# Patient Record
Sex: Female | Born: 1945 | Race: White | Hispanic: No | Marital: Married | Smoking: Never smoker
Health system: Southern US, Community
[De-identification: ages and names within clinical notes are randomized; demographics above are authoritative.]

## PROBLEM LIST (undated history)

## (undated) DIAGNOSIS — I10 Essential (primary) hypertension: Secondary | ICD-10-CM

## (undated) DIAGNOSIS — J45909 Unspecified asthma, uncomplicated: Secondary | ICD-10-CM

## (undated) DIAGNOSIS — R001 Bradycardia, unspecified: Secondary | ICD-10-CM

## (undated) DIAGNOSIS — D369 Benign neoplasm, unspecified site: Secondary | ICD-10-CM

## (undated) DIAGNOSIS — I498 Other specified cardiac arrhythmias: Secondary | ICD-10-CM

## (undated) DIAGNOSIS — D649 Anemia, unspecified: Secondary | ICD-10-CM

## (undated) DIAGNOSIS — E039 Hypothyroidism, unspecified: Secondary | ICD-10-CM

## (undated) DIAGNOSIS — K589 Irritable bowel syndrome without diarrhea: Secondary | ICD-10-CM

## (undated) DIAGNOSIS — J449 Chronic obstructive pulmonary disease, unspecified: Secondary | ICD-10-CM

## (undated) DIAGNOSIS — M1990 Unspecified osteoarthritis, unspecified site: Secondary | ICD-10-CM

## (undated) DIAGNOSIS — K5790 Diverticulosis of intestine, part unspecified, without perforation or abscess without bleeding: Secondary | ICD-10-CM

## (undated) DIAGNOSIS — K219 Gastro-esophageal reflux disease without esophagitis: Secondary | ICD-10-CM

## (undated) DIAGNOSIS — J439 Emphysema, unspecified: Secondary | ICD-10-CM

## (undated) DIAGNOSIS — I341 Nonrheumatic mitral (valve) prolapse: Secondary | ICD-10-CM

## (undated) HISTORY — DX: Emphysema, unspecified: J43.9

## (undated) HISTORY — PX: BACK SURGERY: SHX140

## (undated) HISTORY — DX: Unspecified osteoarthritis, unspecified site: M19.90

## (undated) HISTORY — DX: Irritable bowel syndrome, unspecified: K58.9

## (undated) HISTORY — DX: Other specified cardiac arrhythmias: I49.8

## (undated) HISTORY — PX: BLADDER SURGERY: SHX569

## (undated) HISTORY — PX: EYE SURGERY: SHX253

## (undated) HISTORY — DX: Diverticulosis of intestine, part unspecified, without perforation or abscess without bleeding: K57.90

## (undated) HISTORY — DX: Nonrheumatic mitral (valve) prolapse: I34.1

## (undated) HISTORY — DX: Essential (primary) hypertension: I10

## (undated) HISTORY — DX: Anemia, unspecified: D64.9

## (undated) HISTORY — DX: Hypothyroidism, unspecified: E03.9

## (undated) HISTORY — DX: Chronic obstructive pulmonary disease, unspecified: J44.9

## (undated) HISTORY — DX: Unspecified asthma, uncomplicated: J45.909

## (undated) HISTORY — PX: OTHER SURGICAL HISTORY: SHX169

## (undated) HISTORY — PX: ABDOMINAL HYSTERECTOMY: SHX81

## (undated) HISTORY — PX: APPENDECTOMY: SHX54

## (undated) HISTORY — DX: Benign neoplasm, unspecified site: D36.9

## (undated) HISTORY — DX: Bradycardia, unspecified: R00.1

## (undated) HISTORY — PX: CHOLECYSTECTOMY: SHX55

## (undated) HISTORY — DX: Gastro-esophageal reflux disease without esophagitis: K21.9

---

## 1971-03-06 HISTORY — PX: APPENDECTOMY: SHX54

## 1974-03-05 HISTORY — PX: OTHER SURGICAL HISTORY: SHX169

## 1978-03-05 HISTORY — PX: CHOLECYSTECTOMY: SHX55

## 1992-03-05 HISTORY — PX: OTHER SURGICAL HISTORY: SHX169

## 1997-06-24 ENCOUNTER — Encounter: Admission: RE | Admit: 1997-06-24 | Discharge: 1997-06-24 | Payer: Self-pay | Admitting: Internal Medicine

## 1997-10-13 ENCOUNTER — Encounter: Admission: RE | Admit: 1997-10-13 | Discharge: 1997-10-13 | Payer: Self-pay | Admitting: Hematology and Oncology

## 1997-10-25 ENCOUNTER — Encounter: Admission: RE | Admit: 1997-10-25 | Discharge: 1997-10-25 | Payer: Self-pay | Admitting: Family Medicine

## 1997-11-24 ENCOUNTER — Encounter: Admission: RE | Admit: 1997-11-24 | Discharge: 1997-11-24 | Payer: Self-pay | Admitting: Family Medicine

## 1998-03-11 ENCOUNTER — Encounter: Admission: RE | Admit: 1998-03-11 | Discharge: 1998-03-11 | Payer: Self-pay | Admitting: Sports Medicine

## 1998-05-24 ENCOUNTER — Encounter: Admission: RE | Admit: 1998-05-24 | Discharge: 1998-05-24 | Payer: Self-pay | Admitting: Sports Medicine

## 1998-06-08 ENCOUNTER — Encounter: Admission: RE | Admit: 1998-06-08 | Discharge: 1998-06-08 | Payer: Self-pay | Admitting: Family Medicine

## 1998-07-11 ENCOUNTER — Other Ambulatory Visit: Admission: RE | Admit: 1998-07-11 | Discharge: 1998-07-11 | Payer: Self-pay

## 1999-02-28 ENCOUNTER — Encounter: Admission: RE | Admit: 1999-02-28 | Discharge: 1999-02-28 | Payer: Self-pay | Admitting: Family Medicine

## 1999-03-06 HISTORY — PX: OTHER SURGICAL HISTORY: SHX169

## 1999-03-21 ENCOUNTER — Encounter: Admission: RE | Admit: 1999-03-21 | Discharge: 1999-03-21 | Payer: Self-pay | Admitting: Family Medicine

## 1999-09-07 ENCOUNTER — Encounter: Admission: RE | Admit: 1999-09-07 | Discharge: 1999-09-07 | Payer: Self-pay | Admitting: Family Medicine

## 2000-06-24 ENCOUNTER — Encounter: Payer: Self-pay | Admitting: Neurosurgery

## 2000-06-24 ENCOUNTER — Encounter: Admission: RE | Admit: 2000-06-24 | Discharge: 2000-06-24 | Payer: Self-pay | Admitting: Neurosurgery

## 2001-03-26 ENCOUNTER — Ambulatory Visit (HOSPITAL_COMMUNITY): Admission: RE | Admit: 2001-03-26 | Discharge: 2001-03-26 | Payer: Self-pay | Admitting: Neurosurgery

## 2001-08-04 ENCOUNTER — Encounter: Admission: RE | Admit: 2001-08-04 | Discharge: 2001-08-04 | Payer: Self-pay | Admitting: Family Medicine

## 2001-08-04 ENCOUNTER — Encounter: Payer: Self-pay | Admitting: Family Medicine

## 2001-08-28 ENCOUNTER — Encounter: Admission: RE | Admit: 2001-08-28 | Discharge: 2001-08-28 | Payer: Self-pay | Admitting: Otolaryngology

## 2001-08-28 ENCOUNTER — Encounter: Payer: Self-pay | Admitting: Otolaryngology

## 2001-10-27 ENCOUNTER — Encounter: Payer: Self-pay | Admitting: Internal Medicine

## 2001-10-27 ENCOUNTER — Inpatient Hospital Stay (HOSPITAL_COMMUNITY): Admission: EM | Admit: 2001-10-27 | Discharge: 2001-10-29 | Payer: Self-pay | Admitting: Emergency Medicine

## 2001-11-06 ENCOUNTER — Ambulatory Visit (HOSPITAL_COMMUNITY): Admission: RE | Admit: 2001-11-06 | Discharge: 2001-11-06 | Payer: Self-pay | Admitting: Gastroenterology

## 2002-01-06 ENCOUNTER — Ambulatory Visit (HOSPITAL_COMMUNITY): Admission: RE | Admit: 2002-01-06 | Discharge: 2002-01-06 | Payer: Self-pay | Admitting: Neurosurgery

## 2002-03-26 ENCOUNTER — Observation Stay (HOSPITAL_COMMUNITY): Admission: RE | Admit: 2002-03-26 | Discharge: 2002-03-27 | Payer: Self-pay | Admitting: General Surgery

## 2002-03-26 ENCOUNTER — Encounter: Payer: Self-pay | Admitting: General Surgery

## 2002-06-11 ENCOUNTER — Ambulatory Visit (HOSPITAL_COMMUNITY): Admission: RE | Admit: 2002-06-11 | Discharge: 2002-06-11 | Payer: Self-pay | Admitting: Gastroenterology

## 2002-06-11 ENCOUNTER — Encounter (INDEPENDENT_AMBULATORY_CARE_PROVIDER_SITE_OTHER): Payer: Self-pay | Admitting: *Deleted

## 2002-06-17 ENCOUNTER — Other Ambulatory Visit: Admission: RE | Admit: 2002-06-17 | Discharge: 2002-06-17 | Payer: Self-pay

## 2002-06-26 ENCOUNTER — Encounter: Admission: RE | Admit: 2002-06-26 | Discharge: 2002-06-26 | Payer: Self-pay

## 2002-07-17 ENCOUNTER — Encounter: Admission: RE | Admit: 2002-07-17 | Discharge: 2002-07-17 | Payer: Self-pay | Admitting: Family Medicine

## 2002-07-17 ENCOUNTER — Encounter: Payer: Self-pay | Admitting: Family Medicine

## 2003-07-15 ENCOUNTER — Encounter: Admission: RE | Admit: 2003-07-15 | Discharge: 2003-07-15 | Payer: Self-pay | Admitting: Family Medicine

## 2005-03-22 ENCOUNTER — Encounter: Admission: RE | Admit: 2005-03-22 | Discharge: 2005-03-22 | Payer: Self-pay | Admitting: Family Medicine

## 2005-05-11 ENCOUNTER — Encounter (INDEPENDENT_AMBULATORY_CARE_PROVIDER_SITE_OTHER): Payer: Self-pay | Admitting: *Deleted

## 2005-05-11 ENCOUNTER — Encounter: Admission: RE | Admit: 2005-05-11 | Discharge: 2005-05-11 | Payer: Self-pay | Admitting: Family Medicine

## 2005-07-10 ENCOUNTER — Encounter: Admission: RE | Admit: 2005-07-10 | Discharge: 2005-07-10 | Payer: Self-pay | Admitting: Neurology

## 2005-10-23 ENCOUNTER — Inpatient Hospital Stay (HOSPITAL_COMMUNITY): Admission: RE | Admit: 2005-10-23 | Discharge: 2005-10-30 | Payer: Self-pay | Admitting: Neurosurgery

## 2006-03-05 HISTORY — PX: ABDOMINAL HERNIA REPAIR: SHX539

## 2006-03-05 HISTORY — PX: BRAIN SURGERY: SHX531

## 2007-03-06 HISTORY — PX: OTHER SURGICAL HISTORY: SHX169

## 2007-11-24 ENCOUNTER — Encounter: Payer: Self-pay | Admitting: Gastroenterology

## 2008-05-17 ENCOUNTER — Ambulatory Visit: Payer: Self-pay | Admitting: Family Medicine

## 2008-05-17 DIAGNOSIS — R1013 Epigastric pain: Secondary | ICD-10-CM

## 2008-05-17 DIAGNOSIS — K5900 Constipation, unspecified: Secondary | ICD-10-CM | POA: Insufficient documentation

## 2008-05-17 DIAGNOSIS — K219 Gastro-esophageal reflux disease without esophagitis: Secondary | ICD-10-CM

## 2008-05-18 ENCOUNTER — Encounter: Payer: Self-pay | Admitting: Family Medicine

## 2008-05-18 LAB — CONVERTED CEMR LAB
AST: 13 units/L (ref 0–37)
Albumin: 4.6 g/dL (ref 3.5–5.2)
Alkaline Phosphatase: 70 units/L (ref 39–117)
Basophils Absolute: 0.1 10*3/uL (ref 0.0–0.1)
Basophils Relative: 1 % (ref 0–1)
Calcium: 9.5 mg/dL (ref 8.4–10.5)
Eosinophils Absolute: 0.1 10*3/uL (ref 0.0–0.7)
Eosinophils Relative: 2 % (ref 0–5)
HCT: 35.1 % — ABNORMAL LOW (ref 36.0–46.0)
Lymphocytes Relative: 49 % — ABNORMAL HIGH (ref 12–46)
MCHC: 31.9 g/dL (ref 30.0–36.0)
MCV: 95.9 fL (ref 78.0–100.0)
Monocytes Absolute: 0.7 10*3/uL (ref 0.1–1.0)
Monocytes Relative: 9 % (ref 3–12)
RBC: 3.66 M/uL — ABNORMAL LOW (ref 3.87–5.11)
Total Bilirubin: 0.3 mg/dL (ref 0.3–1.2)
Total Protein: 7.3 g/dL (ref 6.0–8.3)

## 2008-05-19 LAB — CONVERTED CEMR LAB
T3, Free: 2.7 pg/mL (ref 2.3–4.2)
TIBC: 354 ug/dL (ref 250–470)
UIBC: 293 ug/dL
Vitamin B-12: 429 pg/mL (ref 211–911)

## 2008-06-15 ENCOUNTER — Ambulatory Visit: Payer: Self-pay | Admitting: Family Medicine

## 2008-06-17 LAB — CONVERTED CEMR LAB
AST: 14 units/L (ref 0–37)
Albumin: 4.4 g/dL (ref 3.5–5.2)
Alkaline Phosphatase: 71 units/L (ref 39–117)
Basophils Absolute: 0 10*3/uL (ref 0.0–0.1)
CO2: 25 meq/L (ref 19–32)
Eosinophils Absolute: 0.1 10*3/uL (ref 0.0–0.7)
Eosinophils Relative: 1 % (ref 0–5)
Glucose, Bld: 91 mg/dL (ref 70–99)
HCT: 35.5 % — ABNORMAL LOW (ref 36.0–46.0)
Hemoglobin: 11.3 g/dL — ABNORMAL LOW (ref 12.0–15.0)
Lipase: 31 units/L (ref 0–75)
MCHC: 31.8 g/dL (ref 30.0–36.0)
MCV: 97.8 fL (ref 78.0–100.0)
Monocytes Relative: 9 % (ref 3–12)
Neutro Abs: 2.6 10*3/uL (ref 1.7–7.7)
RBC: 3.63 M/uL — ABNORMAL LOW (ref 3.87–5.11)
Total Bilirubin: 0.3 mg/dL (ref 0.3–1.2)
WBC: 6.3 10*3/uL (ref 4.0–10.5)

## 2008-07-14 ENCOUNTER — Ambulatory Visit: Payer: Self-pay | Admitting: Gastroenterology

## 2008-07-14 LAB — CONVERTED CEMR LAB: Sed Rate: 20 mm/hr (ref 0–22)

## 2008-07-16 ENCOUNTER — Ambulatory Visit: Payer: Self-pay | Admitting: Cardiology

## 2008-07-19 ENCOUNTER — Ambulatory Visit: Payer: Self-pay | Admitting: Gastroenterology

## 2008-07-19 ENCOUNTER — Encounter: Payer: Self-pay | Admitting: Gastroenterology

## 2008-07-19 LAB — CONVERTED CEMR LAB: Tissue Transglutaminase Ab, IgA: 0 units (ref <7)

## 2008-07-22 ENCOUNTER — Encounter: Payer: Self-pay | Admitting: Gastroenterology

## 2008-07-28 ENCOUNTER — Ambulatory Visit: Payer: Self-pay | Admitting: Gastroenterology

## 2008-07-30 ENCOUNTER — Encounter: Payer: Self-pay | Admitting: Gastroenterology

## 2008-08-03 ENCOUNTER — Encounter (INDEPENDENT_AMBULATORY_CARE_PROVIDER_SITE_OTHER): Payer: Self-pay | Admitting: *Deleted

## 2008-08-25 ENCOUNTER — Encounter: Payer: Self-pay | Admitting: Cardiology

## 2008-08-25 ENCOUNTER — Ambulatory Visit: Payer: Self-pay | Admitting: Family Medicine

## 2008-08-25 DIAGNOSIS — I498 Other specified cardiac arrhythmias: Secondary | ICD-10-CM

## 2008-08-25 DIAGNOSIS — R5383 Other fatigue: Secondary | ICD-10-CM | POA: Insufficient documentation

## 2008-08-25 DIAGNOSIS — R5381 Other malaise: Secondary | ICD-10-CM

## 2008-08-25 DIAGNOSIS — R109 Unspecified abdominal pain: Secondary | ICD-10-CM | POA: Insufficient documentation

## 2008-08-25 DIAGNOSIS — E039 Hypothyroidism, unspecified: Secondary | ICD-10-CM | POA: Insufficient documentation

## 2008-08-25 HISTORY — DX: Other specified cardiac arrhythmias: I49.8

## 2008-08-25 LAB — CONVERTED CEMR LAB
Bilirubin Urine: NEGATIVE
Glucose, Urine, Semiquant: NEGATIVE
Urobilinogen, UA: 0.2

## 2008-08-26 ENCOUNTER — Encounter: Payer: Self-pay | Admitting: Family Medicine

## 2008-08-26 LAB — CONVERTED CEMR LAB
ALT: 13 units/L (ref 0–35)
AST: 16 units/L (ref 0–37)
Basophils Relative: 1 % (ref 0–1)
Calcium: 9.4 mg/dL (ref 8.4–10.5)
Eosinophils Absolute: 0.1 10*3/uL (ref 0.0–0.7)
Eosinophils Relative: 1 % (ref 0–5)
Glucose, Bld: 104 mg/dL — ABNORMAL HIGH (ref 70–99)
Hemoglobin: 11.2 g/dL — ABNORMAL LOW (ref 12.0–15.0)
MCHC: 32.7 g/dL (ref 30.0–36.0)
MCV: 96.6 fL (ref 78.0–100.0)
RBC: 3.55 M/uL — ABNORMAL LOW (ref 3.87–5.11)
TSH: 5.032 microintl units/mL — ABNORMAL HIGH (ref 0.350–4.500)
Total Protein: 7.2 g/dL (ref 6.0–8.3)
WBC: 6.5 10*3/uL (ref 4.0–10.5)

## 2008-08-27 LAB — CONVERTED CEMR LAB
Saturation Ratios: 27 % (ref 20–55)
UIBC: 273 ug/dL

## 2008-09-01 ENCOUNTER — Encounter: Payer: Self-pay | Admitting: Cardiology

## 2008-09-01 ENCOUNTER — Ambulatory Visit: Payer: Self-pay | Admitting: Cardiology

## 2008-09-21 ENCOUNTER — Ambulatory Visit: Payer: Self-pay | Admitting: Gastroenterology

## 2008-09-28 ENCOUNTER — Ambulatory Visit: Payer: Self-pay | Admitting: Cardiology

## 2008-09-28 ENCOUNTER — Ambulatory Visit: Payer: Self-pay

## 2008-11-09 ENCOUNTER — Telehealth: Payer: Self-pay | Admitting: Cardiology

## 2008-11-19 ENCOUNTER — Ambulatory Visit: Payer: Self-pay | Admitting: Gastroenterology

## 2008-11-19 LAB — CONVERTED CEMR LAB: Creatinine, Ser: 0.8 mg/dL (ref 0.4–1.2)

## 2008-11-24 ENCOUNTER — Telehealth (INDEPENDENT_AMBULATORY_CARE_PROVIDER_SITE_OTHER): Payer: Self-pay | Admitting: *Deleted

## 2008-11-30 ENCOUNTER — Encounter: Payer: Self-pay | Admitting: Gastroenterology

## 2008-11-30 DIAGNOSIS — R933 Abnormal findings on diagnostic imaging of other parts of digestive tract: Secondary | ICD-10-CM | POA: Insufficient documentation

## 2008-12-01 ENCOUNTER — Telehealth (INDEPENDENT_AMBULATORY_CARE_PROVIDER_SITE_OTHER): Payer: Self-pay | Admitting: *Deleted

## 2008-12-03 ENCOUNTER — Telehealth: Payer: Self-pay | Admitting: Gastroenterology

## 2008-12-08 ENCOUNTER — Encounter: Payer: Self-pay | Admitting: Cardiology

## 2008-12-08 ENCOUNTER — Ambulatory Visit: Payer: Self-pay | Admitting: Cardiology

## 2008-12-09 ENCOUNTER — Telehealth (INDEPENDENT_AMBULATORY_CARE_PROVIDER_SITE_OTHER): Payer: Self-pay | Admitting: *Deleted

## 2008-12-09 ENCOUNTER — Ambulatory Visit (HOSPITAL_COMMUNITY): Admission: RE | Admit: 2008-12-09 | Discharge: 2008-12-09 | Payer: Self-pay | Admitting: Gastroenterology

## 2008-12-15 ENCOUNTER — Encounter: Payer: Self-pay | Admitting: Family Medicine

## 2008-12-20 ENCOUNTER — Telehealth: Payer: Self-pay | Admitting: Gastroenterology

## 2008-12-21 ENCOUNTER — Ambulatory Visit (HOSPITAL_COMMUNITY): Admission: RE | Admit: 2008-12-21 | Discharge: 2008-12-21 | Payer: Self-pay | Admitting: Gastroenterology

## 2009-01-06 ENCOUNTER — Ambulatory Visit: Payer: Self-pay | Admitting: Internal Medicine

## 2009-01-06 ENCOUNTER — Ambulatory Visit (HOSPITAL_COMMUNITY): Admission: RE | Admit: 2009-01-06 | Discharge: 2009-01-06 | Payer: Self-pay | Admitting: Cardiology

## 2009-01-06 ENCOUNTER — Encounter: Payer: Self-pay | Admitting: Cardiology

## 2009-01-06 ENCOUNTER — Ambulatory Visit: Payer: Self-pay

## 2009-05-30 ENCOUNTER — Encounter: Payer: Self-pay | Admitting: Family Medicine

## 2009-08-22 ENCOUNTER — Encounter: Admission: RE | Admit: 2009-08-22 | Discharge: 2009-08-22 | Payer: Self-pay | Admitting: Neurosurgery

## 2009-09-13 ENCOUNTER — Ambulatory Visit (HOSPITAL_COMMUNITY): Admission: RE | Admit: 2009-09-13 | Discharge: 2009-09-13 | Payer: Self-pay | Admitting: Podiatry

## 2009-09-29 ENCOUNTER — Encounter: Admission: RE | Admit: 2009-09-29 | Discharge: 2009-09-29 | Payer: Self-pay | Admitting: Neurosurgery

## 2009-11-11 ENCOUNTER — Encounter: Payer: Self-pay | Admitting: Gastroenterology

## 2009-11-11 ENCOUNTER — Telehealth: Payer: Self-pay | Admitting: Gastroenterology

## 2009-11-22 ENCOUNTER — Ambulatory Visit: Payer: Self-pay | Admitting: Gastroenterology

## 2009-11-22 ENCOUNTER — Encounter (INDEPENDENT_AMBULATORY_CARE_PROVIDER_SITE_OTHER): Payer: Self-pay | Admitting: *Deleted

## 2009-11-22 DIAGNOSIS — R1319 Other dysphagia: Secondary | ICD-10-CM | POA: Insufficient documentation

## 2009-11-22 DIAGNOSIS — D509 Iron deficiency anemia, unspecified: Secondary | ICD-10-CM | POA: Insufficient documentation

## 2009-11-22 DIAGNOSIS — R160 Hepatomegaly, not elsewhere classified: Secondary | ICD-10-CM

## 2009-11-22 LAB — CONVERTED CEMR LAB
ALT: 14 units/L (ref 0–35)
AST: 19 units/L (ref 0–37)
Alkaline Phosphatase: 80 units/L (ref 39–117)
Amylase: 93 units/L (ref 27–131)
Bilirubin, Direct: 0 mg/dL (ref 0.0–0.3)
Calcium: 9.5 mg/dL (ref 8.4–10.5)
Chloride: 105 meq/L (ref 96–112)
GFR calc non Af Amer: 80.32 mL/min (ref >60)
HCT: 30.2 % — ABNORMAL LOW (ref 36.0–46.0)
Iron: 60 ug/dL (ref 42–145)
Lipase: 40 units/L (ref 11.0–59.0)
Lymphocytes Relative: 32.5 % (ref 12.0–46.0)
Lymphs Abs: 2 10*3/uL (ref 0.7–4.0)
MCV: 93.8 fL (ref 78.0–100.0)
Magnesium: 1.9 mg/dL (ref 1.5–2.5)
Monocytes Absolute: 0.5 10*3/uL (ref 0.1–1.0)
Neutrophils Relative %: 55.3 % (ref 43.0–77.0)
Potassium: 4.2 meq/L (ref 3.5–5.1)
RBC: 3.22 M/uL — ABNORMAL LOW (ref 3.87–5.11)
TSH: 1.63 microintl units/mL (ref 0.35–5.50)
Tissue Transglutaminase Ab, IgA: 1.2 units (ref <20)
Transferrin: 272 mg/dL (ref 212.0–360.0)

## 2009-11-30 ENCOUNTER — Ambulatory Visit: Payer: Self-pay | Admitting: Gastroenterology

## 2009-12-08 DIAGNOSIS — K200 Eosinophilic esophagitis: Secondary | ICD-10-CM

## 2009-12-15 ENCOUNTER — Encounter: Payer: Self-pay | Admitting: Gastroenterology

## 2010-01-12 ENCOUNTER — Ambulatory Visit: Payer: Self-pay | Admitting: Gastroenterology

## 2010-03-26 ENCOUNTER — Encounter: Payer: Self-pay | Admitting: Gastroenterology

## 2010-04-04 NOTE — Progress Notes (Signed)
Summary: Switch from Dr Christella Hartigan to Dr Jarold Motto  Phone Note From Other Clinic   Caller: Raynelle Fanning @ Dr Maurice Small [phone]  Call For: Dr Christella Hartigan Reason for Call: Schedule Patient Appt Summary of Call: Wants to switch from Dr Christella Hartigan to Dr Jarold Motto because she says at her last rev Dr Christella Hartigan told her she couldnt come back unless she had money to pay him. Initial call taken by: Leanor Kail [facility],  November 11, 2009 3:56 PM  Follow-up for Phone Call        I don't recall ever saying that to her, but I am fine with a switch if Dr. Jarold Motto is OK with it.  I am also fine continuing to care for her. Follow-up by: Rachael Fee MD,  November 14, 2009 11:57 AM  Additional Follow-up for Phone Call Additional follow up Details #1::        Dr Jarold Motto I spoke with Aurea Graff and she said that you would have to decide whether or not you want to see the pt.  She has asked to switch, therefore you have to accept or not. Additional Follow-up by: Chales Abrahams CMA Duncan Dull),  November 14, 2009 12:59 PM    Additional Follow-up for Phone Call Additional follow up Details #2::    I doubt sincereely this is true...we are to discourage this type of Dr. transfer...ask Aurea Graff.... Follow-up by: Mardella Layman MD FACG,  November 14, 2009 12:22 PM  Additional Follow-up for Phone Call Additional follow up Details #3:: Details for Additional Follow-up Action Taken: ok Additional Follow-up by: Mardella Layman MD FACG,  November 14, 2009 1:37 PM  Dr Jarold Motto is that an OK for the switch to you?  Appended Document: Switch from Dr Christella Hartigan to Dr Jarold Motto pt scheduled with Dr Jarold Motto

## 2010-04-04 NOTE — Letter (Signed)
Summary: Patient [facility] Biopsy Results  [facility]  [address] [place], [place] [address]   Phone: [phone]  Fax: [phone]        December 15, 2009 MRN: [id number]    Jenna Gutierrez [address] [place], [place]  [address]    Dear [name],  I am pleased to inform you that the biopsies taken during your recent endoscopic examination did not show any evidence of cancer upon pathologic examination.  Additional information/recommendations:  __No further action is needed at this time.  Please follow-up with      your primary care physician for your other healthcare needs.  __ Please call [phone] to schedule a return visit to review      your condition.  _xx_ Continue with the treatment plan as outlined on the day of your      exam.  __ You should have a repeat endoscopic examination for this problem              in _ months/years.   Please call us if you are having persistent problems or have questions about your condition that have not been fully answered at this time.  Sincerely,  Mardella Layman MD [facility]  This letter has been electronically signed by your physician.  Pt given results by Drs Merideth Abbey while Dr Jarold Motto off

## 2010-04-04 NOTE — Assessment & Plan Note (Signed)
Summary: worseing dysphagia/pl         Dr switch ok per jacobs and pat...   History of Present Illness Visit Type: Follow-up Visit Primary GI MD: Rob Bunting MD Primary Provider: Maurice Small, MD Chief Complaint: Dysphagia, food, pills getting stuck in esophagus, patient also getting stragled History of Present Illness:   Complex 65 year old white female former patient of Dr. Christella Hartigan and also Dr. Reece Agar. She is referred today by Dr. Maurice Small for evaluation of progressive solid food dysphagia. She has a previous known Schatzki's ring in her esophagus that was dilated by balloon hydrostatic techniques per Dr. Christella Hartigan approximately year ago. Small bowel biopsy and check for H. pylori was negative at that time.  She is on chronic PPI therapy for GERD and most recently was placed on Kapidex 60 mg q.a.m. She also uses p.r.n. Carafate for regurgitation and burning substernal chest pain. She has had progressive dysphagia for solids in both her retropharyngeal area and the distal esophageal area. Other problems include recurrent epigastric pain with early satiety, nausea, and alleged 40 pound weight loss. Previous gastric MUGA scan showed 35% retention at 2 hours. She has had previous colonoscopies which were unremarkable. She has regular bowel movements as long as" I do not eat". Other medical problems include asthma and mild COPD.review of recent labs shows a hemoglobin of 10.7 with a normal platelet count and white cell count.   GI Review of Systems    Reports dysphagia with liquids, dysphagia with solids, loss of appetite, nausea, and  vomiting.      Denies abdominal pain, acid reflux, belching, bloating, chest pain, heartburn, vomiting blood, weight loss, and  weight gain.         Current Medications (verified): 1)  Proair Hfa 108 (90 Base) Mcg/act Aers (Albuterol Sulfate) .... 2-4 Puffs Inhaled Every 4-6 Hours As Needed. 2)  Flonase 50 Mcg/act Susp (Fluticasone Propionate) ....  Use One Spray Each Nostril Twice A Day 3)  Kapidex 60 Mg Cpdr (Dexlansoprazole) .... Take 1 Tablet By Mouth Once A Day About 20 Min Before Breakfast. 4)  Carafate 1 Gm Tabs (Sucralfate) .... Take 1 Hour Before Each Meal and At Bedtime. 5)  Levothroid 50 Mcg Tabs (Levothyroxine Sodium) .... Take 1 Tablet By Mouth Once A Day 6)  Vitamin D 1000 Unit  Tabs (Cholecalciferol) .... Take 1 Tablet By Mouth Once A Day 7)  Nuvigil 250 Mg Tabs (Armodafinil) .... Take 1 Tablet By Mouth Two Times A Day 8)  Xyrem 500 Mg/ml Soln (Sodium Oxybate) .... Take As Directed  Allergies (verified): No Known Drug Allergies  Past History:  Past medical, surgical, family and social histories (including risk factors) reviewed for relevance to current acute and chronic problems.  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  diverticulosis MVP COPD Asthma Narcolepsy  Past Surgical History: Appendectomy 1973 Hysterectomy 1976 Cholecystectomy 1980 BAck and neck in 1994 Bladder tack in 2001 Brain surgery  in 2008 Cataracts 2009 Carpel tunnel 2001 Hernia, abdominal repair in 2008 by Dr. Weatherly [place] liver tumor removed.  Bunion Surgery    Family History: Reviewed history from 09/01/2008 and no changes required. Sister with esophageal cancer.  ovarian cancer Mother with congestive heart failure  Social History: Reviewed history from 09/01/2008 and no changes required. Widowed. On disability.  Has 2 children. Lives with a friend and her daughter.   Former Smoker - 10/2006 Alcohol use-no Drug use-no Regular exercise-no  Review of Systems       The patient complains of anemia, back pain, fatigue, and swelling of feet/legs.  The patient denies allergy/sinus, anxiety-new, arthritis/joint pain, blood in urine, breast changes/lumps, change in vision, confusion, cough, coughing up blood,  depression-new, fainting, fever, headaches-new, hearing problems, heart murmur, heart rhythm changes, itching, menstrual pain, muscle pains/cramps, night sweats, nosebleeds, pregnancy symptoms, shortness of breath, skin rash, sleeping problems, sore throat, swollen lymph glands, thirst - excessive , urination - excessive , urination changes/pain, urine leakage, vision changes, and voice change.    Vital Signs:  Patient profile:   65 year old female Height:      62 inches Weight:      160 pounds BMI:     29.37 Pulse rate:   60 / minute Pulse rhythm:   regular BP sitting:   122 / 76  (left arm) Cuff size:   regular  Vitals Entered By: June McMurray CMA Duncan Dull) (November 22, 2009 3:05 PM)  Physical Exam  General:  Well developed, well nourished, no acute distress. Head:  Normocephalic and atraumatic. Eyes:  PERRLA, no icterus.exam deferred to patient's ophthalmologist.   Mouth:  No deformity or lesions, dentition normal. Neck:  Supple; no masses or thyromegaly. Lungs:  Clear throughout to auscultation. Heart:  Regular rate and rhythm; no murmurs, rubs,  or bruits. Abdomen:  Enlarged liver with globular tender left lobe noted. No definite splenomegaly, other abdominal masses or tenderness. I cannot appreciate a secussion splash, and bowel sounds are otherwise normal. Rectal exam is deferred. Extremities:  No clubbing, cyanosis, edema or deformities noted. Neurologic:  Alert and  oriented x4;  grossly normal neurologically. Psych:  Alert and cooperative. Normal mood and affect.   Impression & Recommendations:  Problem # 1:  OTHER DYSPHAGIA (ICD-787.29) Assessment Deteriorated Probable recurrent GERD and peptic stricture the esophagus. I have scheduled her for endoscopy and repeat esophageal dilatation. We will also obtain biopsies for eosinophilic esophagitis. She has obvious delayed gastric emptying her symptomatology and abnormal gastric emptying scan. I have described domperidone 10  mg t.i.d. before meals. Malabsorption workup also initiated. TLB-CBC Platelet - w/Differential (85025-CBCD) TLB-BMP (Basic Metabolic Panel-BMET) (80048-METABOL) TLB-Hepatic/Liver Function Pnl (80076-HEPATIC) TLB-TSH (Thyroid Stimulating Hormone) (84443-TSH) TLB-B12, Serum-Total ONLY ([address]-U04) TLB-Ferritin (82728-FER) TLB-Folic Acid (Folate) (82746-FOL) TLB-IBC Pnl (Iron/FE;Transferrin) (83550-IBC) TLB-Amylase (82150-AMYL) TLB-Lipase (83690-LIPASE) TLB-Magnesium (Mg) (83735-MG) T-Beta Carotene (540[address]-11914) T-Sprue Panel (Celiac Disease Aby Eval) (83516x3/[address]) EGD SAV (EGD SAV)  Problem # 2:  EPIGASTRIC PAIN (ICD-789.06) Assessment: Unchanged probably related to gastroparesis per above.  Problem # 3:  HEPATOMEGALY (ICD-789.1) Assessment: Unchanged Liver Enzymes ordered. She has a vague history of previous resection of a benign liver tumor at the time of her cholecystectomy. Dr. Christella Hartigan had ordered MRCP which was never proved nor completed. The plantar endoscopy results she may need repeat ultrasound exam. I have no records concerning her previous surgical procedures. 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 We may need to consider percutaneous liver biopsy.  Patient Instructions: 1)  Copy sent to : Maurice Small, MD 2)  We have sent you an rx for Domperidone to:  3)  [facility] 4)  [address])  [place], [place]  [address] 6)  Phone:   [phone]   Fax:   [phone] 7)  Your procedure has been scheduled for 11/30/2009, please follow the seperate instructions.  8)  Please go to the basement today for your labs.  9)  [facility] Patient Information Guide given to patient.  10)  Upper Endoscopy brochure given.  11)  Upper Endoscopy with Dilatation brochure given.  12)  The medication list was reviewed and reconciled.  All changed / newly prescribed medications were explained.  A complete medication list was provided to the patient /  caregiver. 13)  Liquids and foods should be eaten in small, frequent meals. Refer to brochure for further instruction.  Prescriptions: DOMPERIDONE 10MG  take one by mouth three times a day  #90 x 2   Entered by:   Harlow Mares CMA (AAMA)   Authorized by:   Mardella Layman MD [facility]   Signed by:   Harlow Mares CMA (AAMA) on 11/22/2009   Method used:   Faxed to ...       [facility]* (retail)       [address]       [place], [place]  [address]       Ph: [phone]       Fax: [phone]   RxID:   [id number]

## 2010-04-04 NOTE — Letter (Signed)
Summary: Maurice Small MD  Maurice Small MD   Imported By: Lester DeForest 11/25/2009 08:49:05  _____________________________________________________________________  External Attachment:    Type:   Image     Comment:   External Document

## 2010-04-04 NOTE — Procedures (Signed)
Summary: Upper Endoscopy  Patient: Jenna Gutierrez Note: All result statuses are Final unless otherwise noted.  Tests: (1) Upper Endoscopy (EGD)   EGD Upper Endoscopy       DONE     [facility]     [address]     [place], [place]  [address]           ENDOSCOPY PROCEDURE REPORT           PATIENT:  Jenna Gutierrez, Jenna Gutierrez  MR#:  [id number]     BIRTHDATE:  23-May-1945, 64 yrs. old  GENDER:  female           ENDOSCOPIST:  Vania Rea. Jarold Motto, MD, [facility]     Referred by:  Maurice Small, M.D.           PROCEDURE DATE:  11/30/2009     PROCEDURE:  EGD with biopsy, Maloney Dilation of Esophagus     ASA CLASS:  Class II     INDICATIONS:  dysphagia           MEDICATIONS:   Fentanyl 50 mcg IV, Versed 6 mg IV     TOPICAL ANESTHETIC:  Exactacain Spray           DESCRIPTION OF PROCEDURE:   After the risks benefits and     alternatives of the procedure were thoroughly explained, informed     consent was obtained.  The LB GIF-H180 [id number] endoscope was     introduced through the mouth and advanced to the second portion of     the duodenum, without limitations.  The instrument was slowly     withdrawn as the mucosa was fully examined.     <<PROCEDUREIMAGES>>           The upper, middle, and distal third of the esophagus were     carefully inspected and no abnormalities were noted. The z-line     was well seen at the GEJ. The endoscope was pushed into the fundus     which was normal including a retroflexed view. The antrum,gastric     body, first and second part of the duodenum were unremarkable.     empiric dilation #30f maloney dilator.esophageal biopsies also     done.    Retroflexed views revealed no abnormalities.    The scope     was then withdrawn from the patient and the procedure completed.           COMPLICATIONS:  None           ENDOSCOPIC IMPRESSION:     1) Normal EGD     ?? OCCULT STRICTURE FROM GERD VS EOSINOPHILIC ESOPHAGITIS VS     MOTILITY DISORDER.     RECOMMENDATIONS:    1) Await BRAVO results     2) Clear liquids until, then soft foods rest iof day. Resume     prior diet tomorrow.     3) OP follow-up in 2 weeks.     4) continue current medications           REPEAT EXAM:  No           ______________________________     Vania Rea. Jarold Motto, MD, [facility]           CC:           n.     eSIGNED:   Vania Rea. Patterson at 11/30/2009 08:20 AM           Osborne Oman, [id number]  Note:  An exclamation mark (!) indicates a result that was not dispersed into the flowsheet. Document Creation Date: 11/30/2009 8:21 AM _______________________________________________________________________  (1) Order result status: Final Collection or observation date-time: 11/30/2009 08:16 Requested date-time:  Receipt date-time:  Reported date-time:  Referring Physician:   Ordering Physician: Sheryn Bison [phone]) Specimen Source:  Source: Launa Grill Order Number: [id number] Lab site:

## 2010-04-04 NOTE — Letter (Signed)
Summary: Internal Correspondence-RELEASE FORM  Internal Correspondence-RELEASE FORM   Imported By: Vanessa Swaziland 05/30/2009 14:39:14  _____________________________________________________________________  External Attachment:    Type:   Image     Comment:   INTERNAL Document

## 2010-04-04 NOTE — Letter (Signed)
Summary: EGD Instructions  [facility]  [address] [place], [place] [address]   Phone: [phone]  Fax: [phone]       Jenna Gutierrez    [birth year]    MRN: [id number]       Procedure Day /Date: 11/30/2009 Wednesday     Arrival Time: 7:30am     Procedure Time: 8:00am     Location of Procedure:                    X [facility] (4th Floor)    PREPARATION FOR ENDOSCOPY   On 11/30/2009 THE DAY OF THE PROCEDURE:  1.   No solid foods, milk or milk products are allowed after midnight the night before your procedure.  2.   Do not drink anything colored red or purple.  Avoid juices with pulp.  No orange juice.  3.  You may drink clear liquids until 6am, which is 2 hours before your procedure.                                                                                                CLEAR LIQUIDS INCLUDE: Water Jello Ice Popsicles Tea (sugar ok, no milk/cream) Powdered fruit flavored drinks Coffee (sugar ok, no milk/cream) Gatorade Juice: apple, white grape, white cranberry  Lemonade Clear bullion, consomm, broth Carbonated beverages (any kind) Strained chicken noodle soup Hard Candy   MEDICATION INSTRUCTIONS  Unless otherwise instructed, you should take regular prescription medications with a small sip of water as early as possible the morning of your procedure.                 OTHER INSTRUCTIONS  You will need a responsible adult at least 64 years of age to accompany you and drive you home.   This person must remain in the waiting room during your procedure.  Wear loose fitting clothing that is easily removed.  Leave jewelry and other valuables at home.  However, you may wish to bring a book to read or an iPod/MP3 player to listen to music as you wait for your procedure to start.  Remove all body piercing jewelry and leave at home.  Total time from sign-in until discharge is approximately 2-3 hours.  You should go home  directly after your procedure and rest.  You can resume normal activities the day after your procedure.  The day of your procedure you should not:   Drive   Make legal decisions   Operate machinery   Drink alcohol   Return to work  You will receive specific instructions about eating, activities and medications before you leave.    The above instructions have been reviewed and explained to me by   _______________________    I fully understand and can verbalize these instructions _____________________________ Date _________

## 2010-06-13 IMAGING — CT CT ABDOMEN W/ CM
2 of 6 series · 17 of 46 positions shown, 19 images · IV contrast (Omnipaque 300)
Comparison: 05/11/2005

CT ABDOMEN

CLINICAL DATA: Epigastric and pelvic pain.  Vomiting.  Bloating.
Blood in stool.

CT ABDOMEN AND PELVIS WITH CONTRAST
TECHNIQUE: Multidetector CT imaging of the abdomen and pelvis was
performed using the standard protocol following bolus
administration of intravenous contrast.
Contrast: 100 ml Zmnipaque-9CC and oral contrast

[Series 3: abd/ pel · axial · 0.79mm/px · z∈[-440,-104]mm · 14 of 79 slices shown, 16 images]
[im 6/79  soft-tissue]
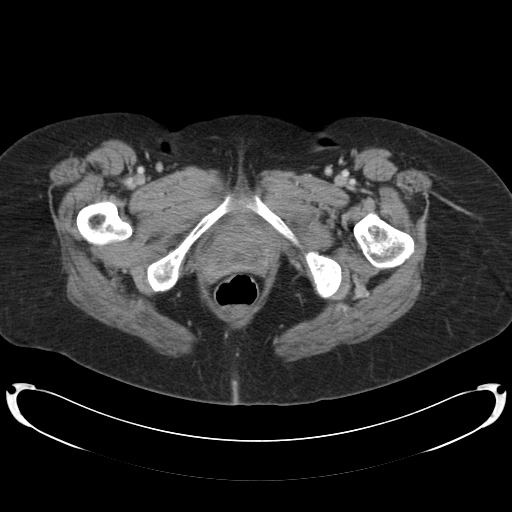
[im 6/79  bone]
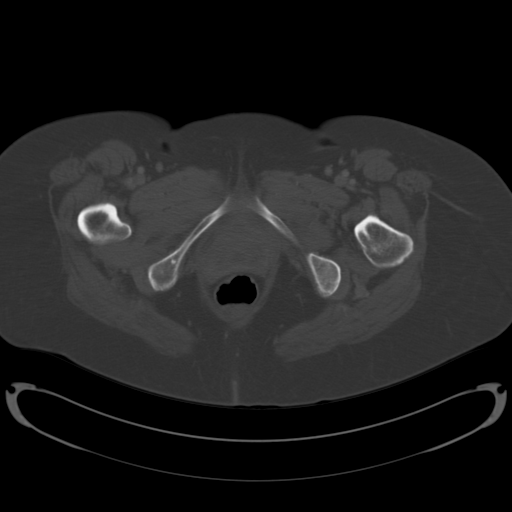
[im 11/79  soft-tissue]
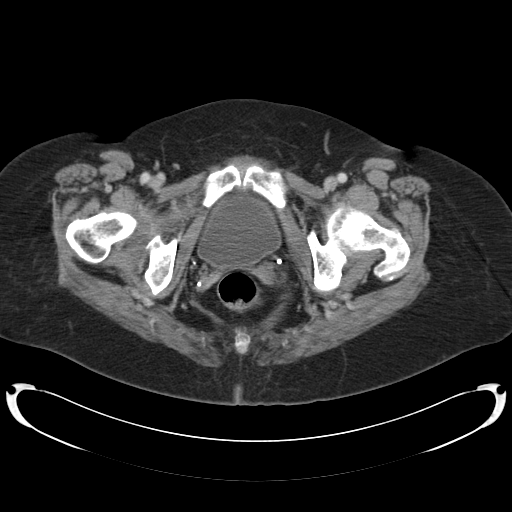
[im 16/79  soft-tissue]
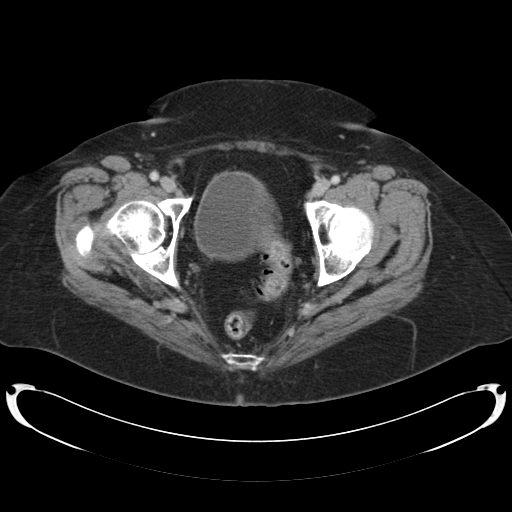
[im 21/79  soft-tissue]
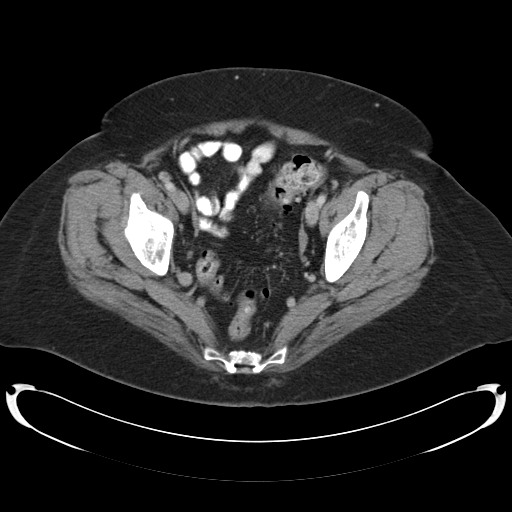
[im 27/79  soft-tissue]
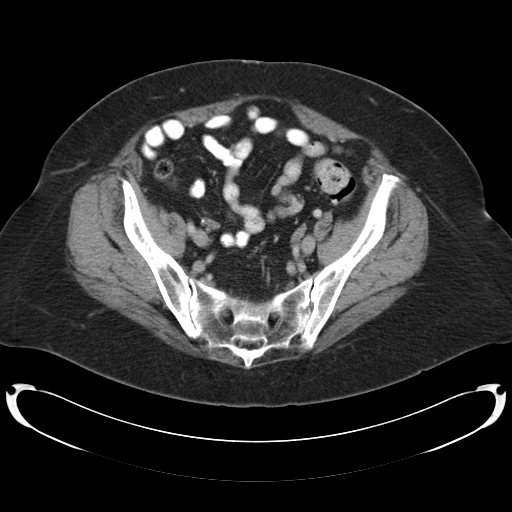
[im 32/79  soft-tissue]
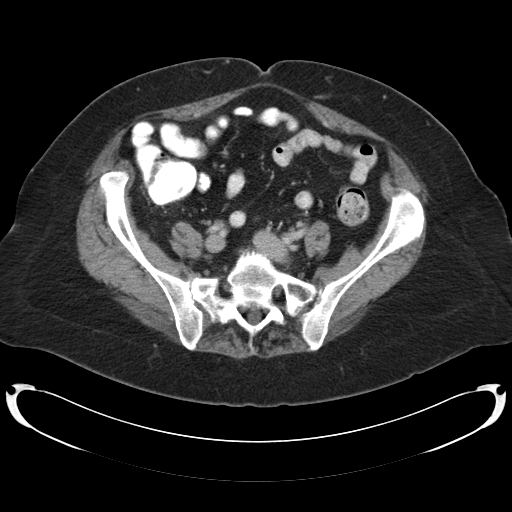
[im 37/79  soft-tissue]
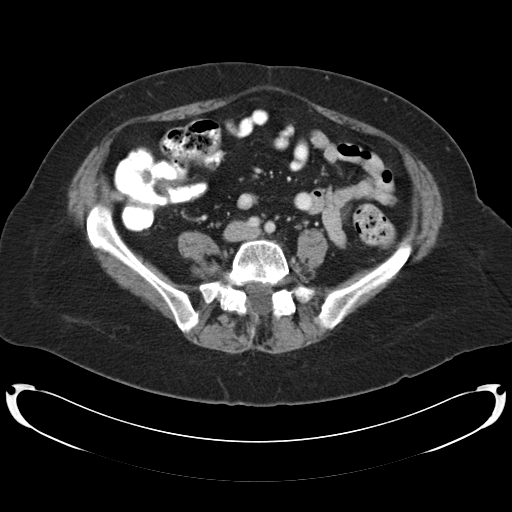
[im 42/79  soft-tissue]
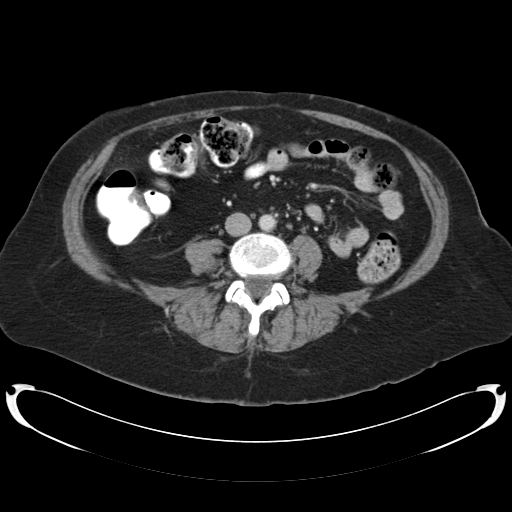
[im 47/79  soft-tissue]
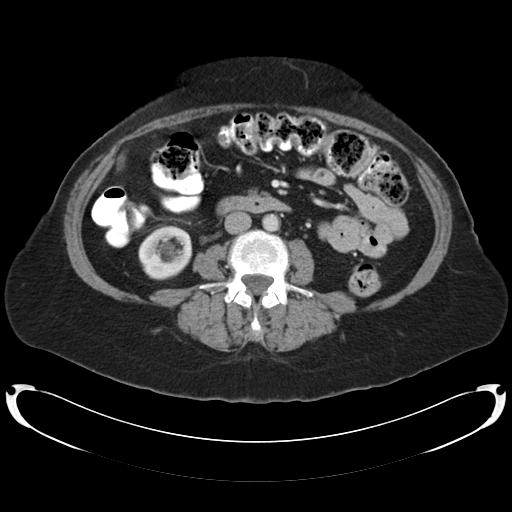
[im 47/79  bone]
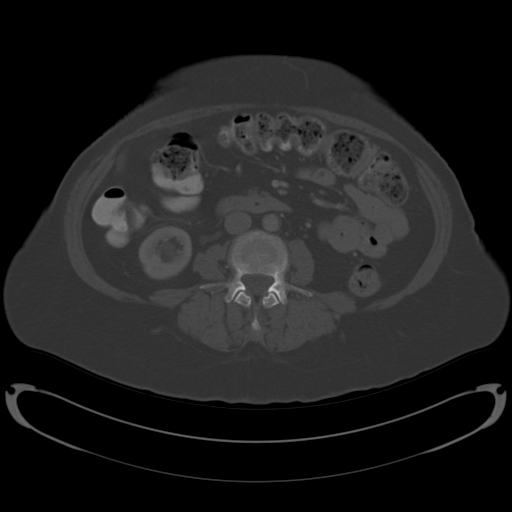
[im 53/79  soft-tissue]
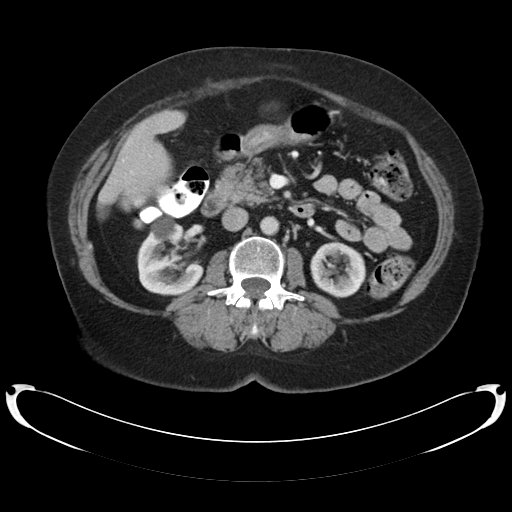
[im 58/79  soft-tissue]
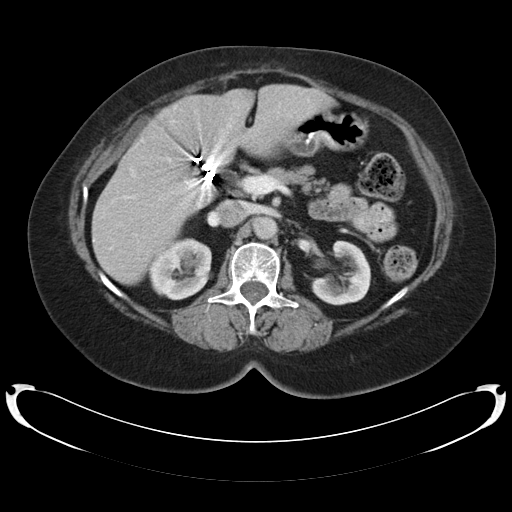
[im 63/79  soft-tissue]
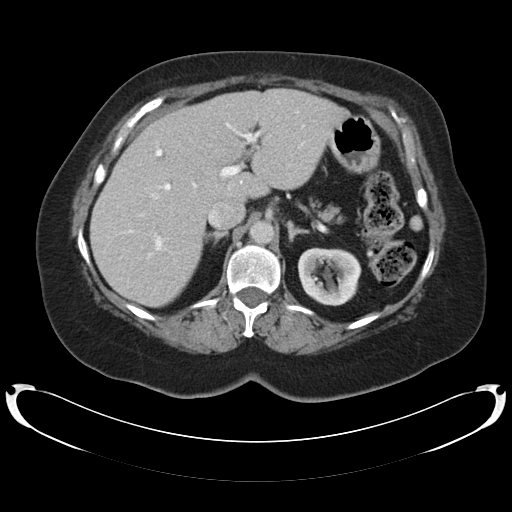
[im 68/79  soft-tissue]
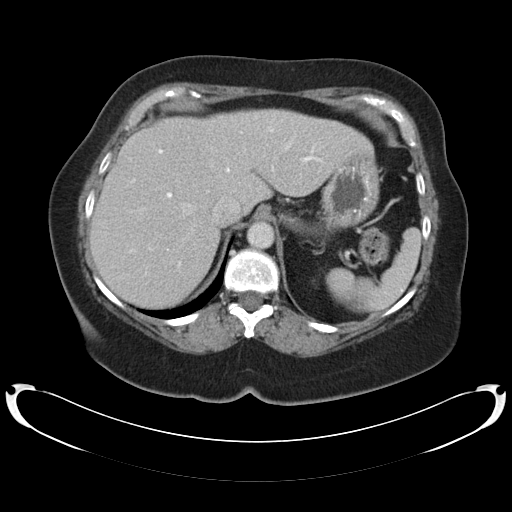
[im 73/79  soft-tissue]
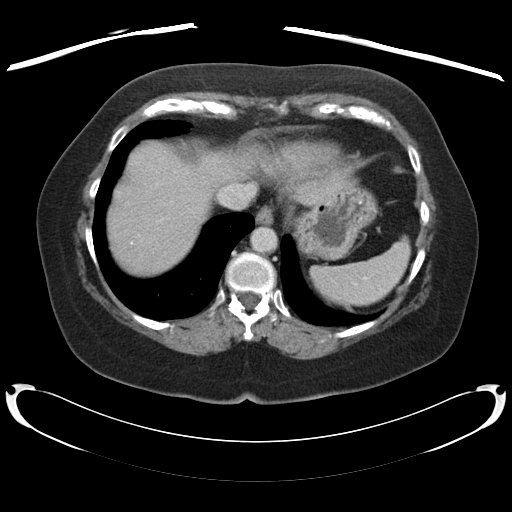

[Series 602: <mpr range> · coronal · 0.81mm/px · 3 of 127 slices shown]
[im 43/127  soft-tissue]
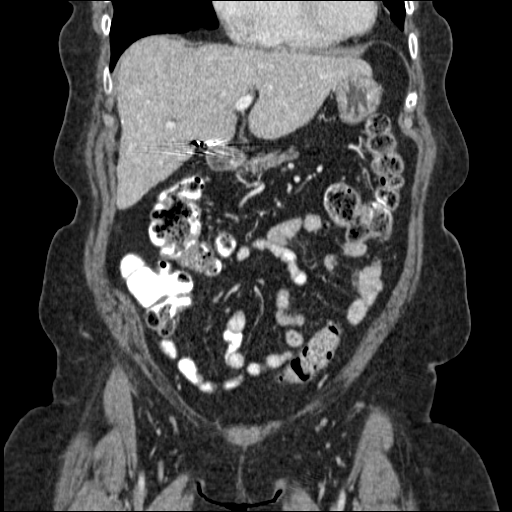
[im 57/127  soft-tissue]
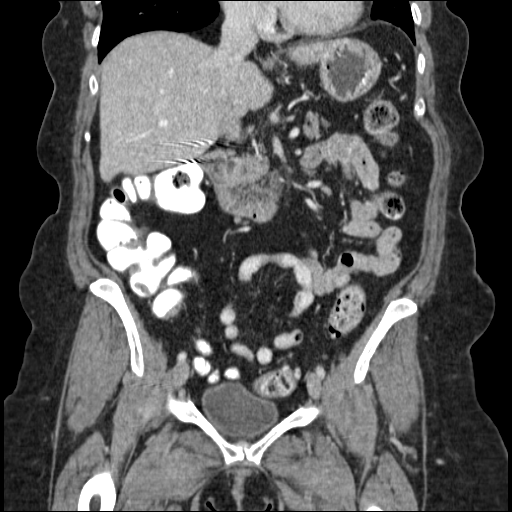
[im 71/127  soft-tissue]
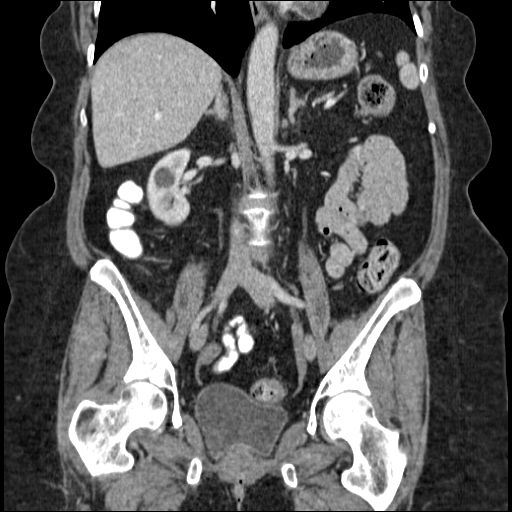

[17 of 46 positions shown; findings below may reference images not displayed]

FINDINGS: Surgical clips again seen from prior cholecystectomy.
The liver, spleen, pancreas, and adrenal glands are normal in
appearance.  Small right renal cyst remains stable and there is no
evidence of renal mass or hydronephrosis.

There is no evidence of abdominal lymphadenopathy or other abnormal
soft tissue masses.  There is no evidence of inflammatory process
or abnormal fluid collections.  Abdominal bowel loops are normal in
appearance.
IMPRESSION: Stable abdomen CT.  No acute findings or other significant
abnormality identified.

CT PELVIS
FINDINGS: Diverticulosis is again seen involve the sigmoid colon,
however there is no evidence of diverticulitis.  No other
inflammatory process or abnormal fluid collections are seen within
the pelvis.  There is no evidence of bowel wall thickening or
dilatation.  There is no evidence of pelvic mass or
lymphadenopathy.  Previous hysterectomy noted.
IMPRESSION: 1.  Stable pelvis CT.  No acute findings.
2.  Sigmoid diverticulosis.

## 2010-07-21 NOTE — Op Note (Signed)
NAME:  Jenna Gutierrez, Jenna Gutierrez                          ACCOUNT NO.:  [id number]   MEDICAL RECORD NO.:  [id number]                   PATIENT TYPE:  AMB   LOCATION:  DAY                                  FACILITY:  [facility]   PHYSICIAN:  Anselm Pancoast. Zachery Dakins, M.D.          DATE OF BIRTH:  Apr 25, 1945   DATE OF PROCEDURE:  03/26/2002  DATE OF DISCHARGE:                                 OPERATIVE REPORT   PREOPERATIVE DIAGNOSIS:  Incisional hernia, old cholecystitis incision.   POSTOPERATIVE DIAGNOSIS:  Incisional hernia, old cholecystitis incision.   OPERATION PERFORMED:  Repair of incisional hernia, mesh reinforcement.   SURGEON:  Anselm Pancoast. Zachery Dakins, M.D.   ASSISTANT:  Nurse.   ANESTHESIA:  General.   INDICATIONS FOR PROCEDURE:  The patient is a 65 year old female who is now  approximately 20 years following a cholecystectomy that I performed.  At the  time she had a benign lesion in the left lobe of the liver that was excised  simultaneously and the incision was a little larger than usual subcostal  cholecystectomy incision.  Approximately six months ago she noticed a little  bulge in the epigastric area.  She has a history of esophageal reflux and is  on medication for this.  She has developed a little bulge that is enlarged  and occasionally has cramping epigastric symptoms thought related to this  hernia.  She has had a CT that was performed as an outpatient recently that  showed no evidence of any abnormality in the left lobe of the liver and is  thought to be having symptoms relating from this hernia.  It is a small  defect but occasionally, you will get a fairly large area prolapsing through  the fascia.  The colon does not appear to be ever in the hernia that I am  aware of, just preperitoneal tissue and omentum that incarcerates.  I  recommended that we repair this and we will use a piece of mesh in the  preperitoneal space.  She is followed by L. Lupe Carney, M.D. and  otherwise is thought to be in good medical history.  Preoperatively she was  given a gram of Kefzol, PAS stockings and was taken to the operating room.   DESCRIPTION OF PROCEDURE:  Induction of general anesthesia, endotracheal  tube, oral tube into the stomach.  Then the abdomen was prepped with  Betadine surgical scrub and solution, draped in a sterile manner.  I had  marked the area where the hernia would bulge out when she would strain and  opened the third of the medial portion of the incision.  Sharp dissection  down into the subcutaneous tissue and then this hernia sac was identified  and it was freed up circumferentially.  It appears that the actual fascial  defect is about the size of a quarter.  Originally, the incision was closed  with Prolene sutures but the hernia sac, or the  omentum that is in it, is  about the width of a lemon with a small defect.  I enlarged the incision  slightly, so I could reduce this easily, sort of straighten the omentum out  so that she should not have any symptoms and then actually freed the area  about 3 to 4 cm in all directions.  A small piece of mesh could be placed  within the peritoneal cavity but the omentum is adherent to this and there  is no colon or bowel that should erode into the mesh.  I then used four  traction sutures in all four corners and placed a piece of mesh about 5 x 6  cm and then two additional sutures superior and inferior to hold the mesh up  to the fascia.  Next, I closed the fascia with 0 Prolenes but kind of picked  up mesh within the midportion and closed the fascia nicely.  It is without  tension.  I then used about 20 cc of Marcaine in the fascia and peritoneum  to minimize postoperative pain.  The subcutaneous tissue was closed with  interrupted sutures of 3-0 Vicryl and then the skin was closed with staples.  The patient tolerated the procedure nicely and was extubated and taken to  recovery room in stable  postoperative condition.  I am planning to keep her  overnight but she should be able to be released in the morning if she is not  having any significant problems with nausea or vomiting which I think it is  most likely she won't.                                                Anselm Pancoast. Zachery Dakins, M.D.    WJW/MEDQ  D:  03/26/2002  T:  03/26/2002  Job:  [id number]   cc:   L. Lupe Carney, M.D.  [address] [place]  [place] [address]  Fax: [phone]

## 2010-07-21 NOTE — Op Note (Signed)
   NAME:  Jenna Gutierrez, Jenna Gutierrez                          ACCOUNT NO.:  [id number]   MEDICAL RECORD NO.:  [id number]                   PATIENT TYPE:  AMB   LOCATION:  ENDO                                 FACILITY:  [facility]   PHYSICIAN:  Danise Edge, M.D.                DATE OF BIRTH:  Jul 18, 1945   DATE OF PROCEDURE:  06/11/2002  DATE OF DISCHARGE:                                 OPERATIVE REPORT   PROCEDURE PERFORMED:  Screening colonoscopy.   REFERRING PHYSICIAN:  Donia Guiles, M.D.   ENDOSCOPIST:  Charolett Bumpers, M.D.   INDICATIONS FOR PROCEDURE:  The patient is a 65 year old female born  February 19, 1946.  [name] is scheduled to undergo her first screening  colonoscopy with polypectomy to prevent colon cancer.   PREMEDICATION:  Versed 10 mg, Demerol 75 mg.   DESCRIPTION OF PROCEDURE:  After obtaining informed consent, the patient was  placed in the left lateral decubitus position.  I administered intravenous  Demerol and intravenous Versed to achieve conscious sedation for the  procedure.  The patient's blood pressure, oxygen saturations and cardiac  rhythm were monitored throughout the procedure and documented in the medical  record.   Anal inspection was normal.  Digital rectal exam was normal.  The pediatric  Olympus adjustable video colonoscope was introduced into the rectum and  advanced to the proximal ascending colon.  Due to colonic loop formation, I  was unable to intubate or examine the cecum and ileocecal valve.  Colonic  preparation for the exam today was excellent.   Rectum:  Normal.   Sigmoid colon and descending colon:  Normal.   Splenic flexure:  Normal.   Transverse colon:  Normal.   Hepatic flexure:  Normal.   Ascending colon:  Normal.   Cecum and ileocecal valve:  Not examined.   ASSESSMENT:  1. Incomplete colonoscopy.  Due to colonic loop formation, I was unable to     examine the cecum and ileocecal     valve.  2. Normal screening  proctocolonoscopy to the proximal ascending colon.  No     endoscopic evidence for the presence of colorectal neoplasia.                                                   Danise Edge, M.D.    MJ/MEDQ  D:  06/11/2002  T:  06/12/2002  Job:  [id number]   cc:   Donia Guiles, M.D.  [address] [place]  [place] [address]  Fax: [phone]   L. Lupe Carney, M.D.  [address] [place]  [place] [address]  Fax: [phone]

## 2010-10-17 ENCOUNTER — Encounter: Payer: Self-pay | Admitting: Cardiology

## 2012-08-20 ENCOUNTER — Encounter: Payer: Self-pay | Admitting: Gastroenterology

## 2014-10-26 ENCOUNTER — Ambulatory Visit (INDEPENDENT_AMBULATORY_CARE_PROVIDER_SITE_OTHER): Payer: Medicare PPO

## 2014-10-26 ENCOUNTER — Ambulatory Visit (INDEPENDENT_AMBULATORY_CARE_PROVIDER_SITE_OTHER): Payer: Medicare PPO | Admitting: Podiatry

## 2014-10-26 ENCOUNTER — Encounter: Payer: Self-pay | Admitting: Podiatry

## 2014-10-26 ENCOUNTER — Ambulatory Visit: Payer: Medicare PPO | Admitting: Podiatry

## 2014-10-26 VITALS — BP 127/74 | HR 71 | Resp 18

## 2014-10-26 DIAGNOSIS — M722 Plantar fascial fibromatosis: Secondary | ICD-10-CM | POA: Diagnosis not present

## 2014-10-26 DIAGNOSIS — R52 Pain, unspecified: Secondary | ICD-10-CM

## 2014-10-26 DIAGNOSIS — M779 Enthesopathy, unspecified: Secondary | ICD-10-CM | POA: Diagnosis not present

## 2014-10-26 MED ORDER — ACETAMINOPHEN-CODEINE #3 300-30 MG PO TABS: 1.0000 | ORAL_TABLET | Freq: Four times a day (QID) | ORAL | Status: DC | PRN

## 2014-10-26 MED ORDER — METHYLPREDNISOLONE 4 MG PO TBPK
ORAL_TABLET | ORAL | Status: DC
Start: 2014-10-26 — End: 2014-11-23

## 2014-10-26 NOTE — Patient Instructions (Signed)
Peroneal Tendinitis with Rehab Tendonitis is inflammation of a tendon. Inflammation of the tendons on the back of the outer ankle (peroneal tendons) is known as peroneal tendonitis. The peroneal tendons are responsible for connecting the muscles that allow you to stand on your tiptoes to the bones of the ankle. For this reason, peroneal tendonitis often causes pain when trying to complete such motions. Peroneal tendonitis often involves a tear (strain) of the peroneal tendons. Strains are classified into three categories. Grade 1 strains cause pain, but the tendon is not lengthened. Grade 2 strains include a lengthened ligament, due to the ligament being stretched or partially ruptured. With grade 2 strains there is still function, although function may be decreased. Grade 3 strains involve a complete tear of the tendon or muscle, and function is usually impaired. SYMPTOMS   Pain, tenderness, swelling, warmth, or redness over the back of the outer side of the ankle, the outer part of the mid-foot, or the bottom of the arch.  Pain that gets worse with ankle motion (especially when pushing off or pushing down with the front of the foot), or when standing on the ball of the foot or pushing the foot outward.  Crackling sound (crepitation) when the tendon is moved or touched. CAUSES  Peroneal tendinitis occurs when injury to the peroneal tendons causes the body to respond with inflammation. Common causes of injury include:  An overuse injury, in which the groove behind the outer ankle (where the tendon is located) causes wear on the tendon.  A sudden stress placed on the tendon, such as from an increase in the intensity, frequency, or duration of training.  Direct hit (trauma) to the tendon.  Return to activity too soon after a previous ankle injury. RISK INCREASES WITH:  Sports that require sudden, repetitive pushing off of the foot, such as jumping or quick starts.  Kicking and running sports,  especially running down hills or long distances.  Poor strength and flexibility.  Previous injury to the foot, ankle, or leg. PREVENTION  Warm up and stretch properly before activity.  Allow for adequate recovery between workouts.  Maintain physical fitness:  Strength, flexibility, and endurance.  Cardiovascular fitness.  Complete rehabilitation after previous injury. PROGNOSIS  If treated properly, peroneal tendonitis usually heals within 6 weeks.  RELATED COMPLICATIONS  Longer healing time, if not properly treated or if not given enough time to heal.  Recurring symptoms if activity is resumed too soon, with overuse, or when using poor technique.  If untreated, tendinitis may result in tendon rupture, requiring surgery. TREATMENT  Treatment first involves the use of ice and medicine to reduce pain and inflammation. The use of strengthening and stretching exercises may help reduce pain with activity. These exercises may be performed at home or with a therapist. Sometimes, the foot and ankle will be restrained for 10 to 14 days to promote healing. Your caregiver may advise that you place a heel lift in your shoes to reduce the stress placed on the tendon. If nonsurgical treatment is unsuccessful, surgery to remove the inflamed tendon lining (sheath) may be advised.  MEDICATION   If pain medicine is needed, nonsteroidal anti-inflammatory medicines (aspirin and ibuprofen), or other minor pain relievers (acetaminophen), are often advised.  Do not take pain medicine for 7 days before surgery.  Prescription pain relievers may be given, if your caregiver thinks they are needed. Use only as directed and only as much as you need. HEAT AND COLD  Cold treatment (icing) should   be applied for 10 to 15 minutes every 2 to 3 hours for inflammation and pain, and immediately after activity that aggravates your symptoms. Use ice packs or an ice massage.  Heat treatment may be used before  performing stretching and strengthening activities prescribed by your caregiver, physical therapist, or athletic trainer. Use a heat pack or a warm water soak. SEEK MEDICAL CARE IF:  Symptoms get worse or do not improve in 2 to 4 weeks, despite treatment.  New, unexplained symptoms develop. (Drugs used in treatment may produce side effects.) EXERCISES RANGE OF MOTION (ROM) AND STRETCHING EXERCISES - Peroneal Tendinitis These exercises may help you when beginning to rehabilitate your injury. Your symptoms may resolve with or without further involvement from your physician, physical therapist or athletic trainer. While completing these exercises, remember:   Restoring tissue flexibility helps normal motion to return to the joints. This allows healthier, less painful movement and activity.  An effective stretch should be held for at least 30 seconds.  A stretch should never be painful. You should only feel a gentle lengthening or release in the stretched tissue. RANGE OF MOTION - Ankle Eversion  Sit with your right / left ankle crossed over your opposite knee.  Grip your foot with your opposite hand, placing your thumb on the top of your foot and your fingers across the bottom of your foot.  Gently push your foot downward with a slight rotation, so your littlest toes rise slightly toward the ceiling.  You should feel a gentle stretch on the inside of your ankle. Hold the stretch for __________ seconds. Repeat __________ times. Complete this exercise __________ times per day.  RANGE OF MOTION - Ankle Inversion  Sit with your right / left ankle crossed over your opposite knee.  Grip your foot with your opposite hand, placing your thumb on the bottom of your foot and your fingers across the top of your foot.  Gently pull your foot so the smallest toe comes toward you and your thumb pushes the inside of the ball of your foot away from you.  You should feel a gentle stretch on the outside of  your ankle. Hold the stretch for __________ seconds. Repeat __________ times. Complete this exercise __________ times per day.  RANGE OF MOTION - Ankle Plantar Flexion  Sit with your right / left leg crossed over your opposite knee.  Use your opposite hand to pull the top of your foot and toes toward you.  You should feel a gentle stretch on the top of your foot and ankle. Hold this position for __________ seconds. Repeat __________ times. Complete __________ times per day.  STRETCH - Gastroc, Standing  Place your hands on a wall.  Extend your right / left leg behind you, keeping the front knee somewhat bent.  Slightly point your toes inward on your back foot.  Keeping your right / left heel on the floor and your knee straight, shift your weight toward the wall, not allowing your back to arch.  You should feel a gentle stretch in the calf. Hold this position for __________ seconds. Repeat __________ times. Complete this stretch __________ times per day. STRETCH - Soleus, Standing  Place your hands on a wall.  Extend your right / left leg behind you, keeping the other knee somewhat bent.  Slightly point your toes inward on your back foot.  Keep your heel on the floor, bend your back knee, and slightly shift your weight over the back leg so that   you feel a gentle stretch deep in your back calf.  Hold this position for __________ seconds. Repeat __________ times. Complete this stretch __________ times per day. STRETCH - Gastrocsoleus, Standing Note: This exercise can place a lot of stress on your foot and ankle. Please complete this exercise only if specifically instructed by your caregiver.   Place the ball of your right / left foot on a step, keeping your other foot firmly on the same step.  Hold on to the wall or a rail for balance.  Slowly lift your other foot, allowing your body weight to press your heel down over the edge of the step.  You should feel a stretch in your  right / left calf.  Hold this position for __________ seconds.  Repeat this exercise with a slight bend in your knee. Repeat __________ times. Complete this stretch __________ times per day.  STRENGTHENING EXERCISES - Peroneal Tendinitis  These exercises may help you when beginning to rehabilitate your injury. They may resolve your symptoms with or without further involvement from your physician, physical therapist or athletic trainer. While completing these exercises, remember:   Muscles can gain both the endurance and the strength needed for everyday activities through controlled exercises.  Complete these exercises as instructed by your physician, physical therapist or athletic trainer. Increase the resistance and repetitions only as guided by your caregiver. STRENGTH - Dorsiflexors  Secure a rubber exercise band or tubing to a fixed object (table, pole) and loop the other end around your right / left foot.  Sit on the floor facing the fixed object. The band should be slightly tense when your foot is relaxed.  Slowly draw your foot back toward you, using your ankle and toes.  Hold this position for __________ seconds. Slowly release the tension in the band and return your foot to the starting position. Repeat __________ times. Complete this exercise __________ times per day.  STRENGTH - Towel Curls  Sit in a chair, on a non-carpeted surface.  Place your foot on a towel, keeping your heel on the floor.  Pull the towel toward your heel only by curling your toes. Keep your heel on the floor.  If instructed by your physician, physical therapist or athletic trainer, add weight to the end of the towel. Repeat __________ times. Complete this exercise __________ times per day. STRENGTH - Ankle Eversion   Secure one end of a rubber exercise band or tubing to a fixed object (table, pole). Loop the other end around your foot, just before your toes.  Place your fists between your knees.  This will focus your strengthening at your ankle.  Drawing the band across your opposite foot, away from the pole, slowly, pull your little toe out and up. Make sure the band is positioned to resist the entire motion.  Hold this position for __________ seconds.  Have your muscles resist the band, as it slowly pulls your foot back to the starting position. Repeat __________ times. Complete this exercise __________ times per day.  Document Released: 02/19/2005 Document Revised: 07/06/2013 Document Reviewed: 06/03/2008 [facility] Patient Information 2015 [place], [place]. This information is not intended to replace advice given to you by your health care provider. Make sure you discuss any questions you have with your health care provider. Plantar Fasciitis (Heel Spur Syndrome) with Rehab The plantar fascia is a fibrous, ligament-like, soft-tissue structure that spans the bottom of the foot. Plantar fasciitis is a condition that causes pain in the foot due to inflammation  of the tissue. SYMPTOMS   Pain and tenderness on the underneath side of the foot.  Pain that worsens with standing or walking. CAUSES  Plantar fasciitis is caused by irritation and injury to the plantar fascia on the underneath side of the foot. Common mechanisms of injury include:  Direct trauma to bottom of the foot.  Damage to a small nerve that runs under the foot where the main fascia attaches to the heel bone.  Stress placed on the plantar fascia due to bone spurs. RISK INCREASES WITH:   Activities that place stress on the plantar fascia (running, jumping, pivoting, or cutting).  Poor strength and flexibility.  Improperly fitted shoes.  Tight calf muscles.  Flat feet.  Failure to warm-up properly before activity.  Obesity. PREVENTION  Warm up and stretch properly before activity.  Allow for adequate recovery between workouts.  Maintain physical fitness:  Strength, flexibility, and  endurance.  Cardiovascular fitness.  Maintain a health body weight.  Avoid stress on the plantar fascia.  Wear properly fitted shoes, including arch supports for individuals who have flat feet. PROGNOSIS  If treated properly, then the symptoms of plantar fasciitis usually resolve without surgery. However, occasionally surgery is necessary. RELATED COMPLICATIONS   Recurrent symptoms that may result in a chronic condition.  Problems of the lower back that are caused by compensating for the injury, such as limping.  Pain or weakness of the foot during push-off following surgery.  Chronic inflammation, scarring, and partial or complete fascia tear, occurring more often from repeated injections. TREATMENT  Treatment initially involves the use of ice and medication to help reduce pain and inflammation. The use of strengthening and stretching exercises may help reduce pain with activity, especially stretches of the Achilles tendon. These exercises may be performed at home or with a therapist. Your caregiver may recommend that you use heel cups of arch supports to help reduce stress on the plantar fascia. Occasionally, corticosteroid injections are given to reduce inflammation. If symptoms persist for greater than 6 months despite non-surgical (conservative), then surgery may be recommended.  MEDICATION   If pain medication is necessary, then nonsteroidal anti-inflammatory medications, such as aspirin and ibuprofen, or other minor pain relievers, such as acetaminophen, are often recommended.  Do not take pain medication within 7 days before surgery.  Prescription pain relievers may be given if deemed necessary by your caregiver. Use only as directed and only as much as you need.  Corticosteroid injections may be given by your caregiver. These injections should be reserved for the most serious cases, because they may only be given a certain number of times. HEAT AND COLD  Cold treatment  (icing) relieves pain and reduces inflammation. Cold treatment should be applied for 10 to 15 minutes every 2 to 3 hours for inflammation and pain and immediately after any activity that aggravates your symptoms. Use ice packs or massage the area with a piece of ice (ice massage).  Heat treatment may be used prior to performing the stretching and strengthening activities prescribed by your caregiver, physical therapist, or athletic trainer. Use a heat pack or soak the injury in warm water. SEEK IMMEDIATE MEDICAL CARE IF:  Treatment seems to offer no benefit, or the condition worsens.  Any medications produce adverse side effects. EXERCISES RANGE OF MOTION (ROM) AND STRETCHING EXERCISES - Plantar Fasciitis (Heel Spur Syndrome) These exercises may help you when beginning to rehabilitate your injury. Your symptoms may resolve with or without further involvement from your physician, physical  therapist or athletic trainer. While completing these exercises, remember:   Restoring tissue flexibility helps normal motion to return to the joints. This allows healthier, less painful movement and activity.  An effective stretch should be held for at least 30 seconds.  A stretch should never be painful. You should only feel a gentle lengthening or release in the stretched tissue. RANGE OF MOTION - Toe Extension, Flexion  Sit with your right / left leg crossed over your opposite knee.  Grasp your toes and gently pull them back toward the top of your foot. You should feel a stretch on the bottom of your toes and/or foot.  Hold this stretch for __________ seconds.  Now, gently pull your toes toward the bottom of your foot. You should feel a stretch on the top of your toes and or foot.  Hold this stretch for __________ seconds. Repeat __________ times. Complete this stretch __________ times per day.  RANGE OF MOTION - Ankle Dorsiflexion, Active Assisted  Remove shoes and sit on a chair that is  preferably not on a carpeted surface.  Place right / left foot under knee. Extend your opposite leg for support.  Keeping your heel down, slide your right / left foot back toward the chair until you feel a stretch at your ankle or calf. If you do not feel a stretch, slide your bottom forward to the edge of the chair, while still keeping your heel down.  Hold this stretch for __________ seconds. Repeat __________ times. Complete this stretch __________ times per day.  STRETCH - Gastroc, Standing  Place hands on wall.  Extend right / left leg, keeping the front knee somewhat bent.  Slightly point your toes inward on your back foot.  Keeping your right / left heel on the floor and your knee straight, shift your weight toward the wall, not allowing your back to arch.  You should feel a gentle stretch in the right / left calf. Hold this position for __________ seconds. Repeat __________ times. Complete this stretch __________ times per day. STRETCH - Soleus, Standing  Place hands on wall.  Extend right / left leg, keeping the other knee somewhat bent.  Slightly point your toes inward on your back foot.  Keep your right / left heel on the floor, bend your back knee, and slightly shift your weight over the back leg so that you feel a gentle stretch deep in your back calf.  Hold this position for __________ seconds. Repeat __________ times. Complete this stretch __________ times per day. STRETCH - Gastrocsoleus, Standing  Note: This exercise can place a lot of stress on your foot and ankle. Please complete this exercise only if specifically instructed by your caregiver.   Place the ball of your right / left foot on a step, keeping your other foot firmly on the same step.  Hold on to the wall or a rail for balance.  Slowly lift your other foot, allowing your body weight to press your heel down over the edge of the step.  You should feel a stretch in your right / left calf.  Hold  this position for __________ seconds.  Repeat this exercise with a slight bend in your right / left knee. Repeat __________ times. Complete this stretch __________ times per day.  STRENGTHENING EXERCISES - Plantar Fasciitis (Heel Spur Syndrome)  These exercises may help you when beginning to rehabilitate your injury. They may resolve your symptoms with or without further involvement from your physician, physical therapist  or Product/process development scientist. While completing these exercises, remember:   Muscles can gain both the endurance and the strength needed for everyday activities through controlled exercises.  Complete these exercises as instructed by your physician, physical therapist or athletic trainer. Progress the resistance and repetitions only as guided. STRENGTH - Towel Curls  Sit in a chair positioned on a non-carpeted surface.  Place your foot on a towel, keeping your heel on the floor.  Pull the towel toward your heel by only curling your toes. Keep your heel on the floor.  If instructed by your physician, physical therapist or athletic trainer, add ____________________ at the end of the towel. Repeat __________ times. Complete this exercise __________ times per day. STRENGTH - Ankle Inversion  Secure one end of a rubber exercise band/tubing to a fixed object (table, pole). Loop the other end around your foot just before your toes.  Place your fists between your knees. This will focus your strengthening at your ankle.  Slowly, pull your big toe up and in, making sure the band/tubing is positioned to resist the entire motion.  Hold this position for __________ seconds.  Have your muscles resist the band/tubing as it slowly pulls your foot back to the starting position. Repeat __________ times. Complete this exercises __________ times per day.  Document Released: 02/19/2005 Document Revised: 05/14/2011 Document Reviewed: 06/03/2008 [facility] Patient Information 2015 [place], [place].  This information is not intended to replace advice given to you by your health care provider. Make sure you discuss any questions you have with your health care provider.

## 2014-10-26 NOTE — Progress Notes (Signed)
   Subjective:    Patient ID: Jenna Gutierrez, female    DOB: [birth year], 69 y.o.   MRN: [id number]  HPI   69 year old female presents the office today with concerns of bilateral foot pain has been ongoing for approximately 2 months or more. She states it is a gradual onset has been progressive. She denies any history of injury or trauma. She said no prior treatment. She states that she has pain when she tries to stand and walk Nadara Mustard feels better when she rests that her feet. She denies any swelling or redness. No tenderness. She states that she does have some burning pain to her feet at night. She states that she has pain in the bottom of her feet which also radiates up to her ankles. No other complaints at this time.   Review of Systems  Constitutional: Positive for fatigue.  HENT: Positive for hearing loss and sinus pressure.   Gastrointestinal:       Bladder problems   Musculoskeletal:       Muscle pain        Objective:   Physical Exam AAO 3, NAD DP/PT pulses palpable, CRT less than 3 seconds Protective sensation decreased with Simms Weinstein monofilament, absent vibratory sensation. Achilles tendon reflex intact. There is tenderness palpation on the plantar medial tubercle of the calcaneus at the insertion of the plantar fascia bilaterally. There is no pain along the course of the plantar fascia within the arch of the foot. Plantar fascia appears to be intact. There is tenderness to palpation along the course of the peroneal tendons bilaterally posterior and inferior to the lateral malleolus on the insertion of the fifth metatarsal base. On the left side there is also mild discomfort along the course of the posterior tibial tendon inferior to the medial malleolus along the insertion into the navicular tuberosity. There is a slight decrease in medial arch height upon weightbearing. There is no specific area of pinpoint bony tenderness or pain the vibratory sensation. There is a  mild left hallux varus deformity. Subjectively she gets some pain in redness to the tip of her left big toe. There is no overlying edema, erythema, increased warmth at this time. No open lesions or pre-ulcerative lesions. No pain with calf compression, swelling, warmth, erythema.    Assessment & Plan:   69 year old female with bilateral tendinitis; no pain, likely plantar fasciitis -X-rays were obtained and reviewed with the patient.  -Treatment options discussed including all alternatives, risks, and complications - discussed likely etiology of her symptoms. - prescribed a Medrol Dosepak. I discussed with her risks and complications for which she understands. Discussed side effects and directed to call the office should any occur. -Plantar fascial braces systems bilaterally -Stretching exercises daily. -Ice to the area daily -Discussion modifications. Also discuss orthotics. -She also has symptoms of neuropathy. Recommended a follow-up with her primary care physician. Discussed possible starting treatment  However she wishes to hold off  2see how she responds the current treatment. -Follow-up 3 weeks or sooner if any problems arise. In the meantime, encouraged to call the office with any questions, concerns, change in symptoms.Follow-up with PCP for other issues mentioned in the ROS.  Celesta Gentile, DPM

## 2014-11-23 ENCOUNTER — Encounter: Payer: Self-pay | Admitting: Podiatry

## 2014-11-23 ENCOUNTER — Ambulatory Visit (INDEPENDENT_AMBULATORY_CARE_PROVIDER_SITE_OTHER): Payer: Medicare PPO | Admitting: Podiatry

## 2014-11-23 VITALS — BP 131/74 | HR 71 | Resp 18

## 2014-11-23 DIAGNOSIS — B070 Plantar wart: Secondary | ICD-10-CM

## 2014-11-23 DIAGNOSIS — M722 Plantar fascial fibromatosis: Secondary | ICD-10-CM | POA: Diagnosis not present

## 2014-11-23 DIAGNOSIS — Q828 Other specified congenital malformations of skin: Secondary | ICD-10-CM | POA: Diagnosis not present

## 2014-11-23 DIAGNOSIS — M779 Enthesopathy, unspecified: Secondary | ICD-10-CM | POA: Diagnosis not present

## 2014-11-23 MED ORDER — METHYLPREDNISOLONE 4 MG PO TBPK: ORAL_TABLET | ORAL | Status: AC

## 2014-11-23 MED ORDER — ACETAMINOPHEN-CODEINE #3 300-30 MG PO TABS: 1.0000 | ORAL_TABLET | Freq: Four times a day (QID) | ORAL | Status: DC | PRN

## 2014-11-24 NOTE — Progress Notes (Signed)
Patient ID: Jenna Gutierrez, female   DOB: 05/24/1945, 69 y.o.   MRN: [id number]  Subjective: 69 year old female presents the office for follow-up evaluation of bilateral tendinitis, plantar fasciitis. She states that she was wearing the brace which seems to help although her pain discontinued. She did not get a Medrol Dosepak after her last appointment. Tylenol 3 is helping. She denies any burning or tingling/numbness. No swelling or redness. No recent injury or trauma. Most of her pain is with weightbearing and prolonged ambulation. The pain does not wake her up at night. No other complaints at this time. No other acute changes.  Objective: AAO x3, NAD DP/PT pulses palpable bilaterally, CRT less than 3 seconds Protective sensation intact with Simms Weinstein monofilament, vibratory sensation intact, Achilles tendon reflex intact There is mild, but continued tenderness to palpation overlying the plantar medial tubercle of the calcaneus to bilateral heels at the insertion of the plantar fascia. There is no pain along the course of plantar fascial within the arch of the foot with the left >right. There is no pain with lateral compression of the calcaneus or pain the vibratory sensation. No pain on the posterior aspect of the calcaneus or along the course/insertion of the Achilles tendon. There is mild mild discomfort with palpation to the peroneal tenons inferior to the lateral malleolus however the tendons appear intact. No pain with eversion. There is decreased tenderness along the course of posterior tibial tendon on the insertion in the navicular tuberosity. There is no overlying edema, erythema, increase in warmth. No other areas of tenderness palpation or pain with vibratory sensation to the foot/ankle. MMT 5/5, ROM WNL There is an annular hyperkeratotic lesion on the plantar aspect of the right hallux. Upon debridement no underlying ulceration, drainage or other signs of infection. No open  lesions or pre-ulcerative lesions are identified. No pain with calf compression, swelling, warmth, erythema.  Assessment: 69 year old female with resolving tendinitis/plantar fasciitis; porokeratosis right plantar hallux  Plan: -Treatment options discussed including all alternatives, risks, and complications -Medrol dose pack was prescribed again today.  -Tylenol 3 was refilled. -Continue stretching, icing activities. Ankle brace as needed. -Discussed supportive shoe gears and also orthotics. -Right hallux lesion was sharply debrided without complication/bleeding. Area was cleaned. -A pad was placed around the lesion followed by salinocaine and a bandage. Post procedure instructions were discussed. Monitor for any clinical signs or symptoms of infection and directed to call the office immediately should any occur or go to the ER. -Follow-up in 3-4 weeks or sooner if any problems arise. In the meantime, encouraged to call the office with any questions, concerns, change in symptoms.    Celesta Gentile, DPM

## 2014-12-02 ENCOUNTER — Encounter: Payer: Self-pay | Admitting: Cardiology

## 2014-12-12 ENCOUNTER — Emergency Department
Admission: EM | Admit: 2014-12-12 | Discharge: 2014-12-13 | Disposition: A | Discharge status: Home / Self Care | Payer: Medicare PPO | Attending: Student | Admitting: Student

## 2014-12-12 ENCOUNTER — Other Ambulatory Visit: Payer: Self-pay

## 2014-12-12 ENCOUNTER — Emergency Department: Payer: Medicare PPO

## 2014-12-12 DIAGNOSIS — N390 Urinary tract infection, site not specified: Secondary | ICD-10-CM | POA: Insufficient documentation

## 2014-12-12 DIAGNOSIS — M545 Low back pain: Secondary | ICD-10-CM | POA: Insufficient documentation

## 2014-12-12 DIAGNOSIS — G8929 Other chronic pain: Secondary | ICD-10-CM | POA: Diagnosis not present

## 2014-12-12 DIAGNOSIS — R2242 Localized swelling, mass and lump, left lower limb: Secondary | ICD-10-CM | POA: Insufficient documentation

## 2014-12-12 DIAGNOSIS — I10 Essential (primary) hypertension: Secondary | ICD-10-CM | POA: Diagnosis not present

## 2014-12-12 DIAGNOSIS — Z7951 Long term (current) use of inhaled steroids: Secondary | ICD-10-CM | POA: Diagnosis not present

## 2014-12-12 DIAGNOSIS — M25562 Pain in left knee: Secondary | ICD-10-CM

## 2014-12-12 DIAGNOSIS — Z79899 Other long term (current) drug therapy: Secondary | ICD-10-CM | POA: Insufficient documentation

## 2014-12-12 DIAGNOSIS — J441 Chronic obstructive pulmonary disease with (acute) exacerbation: Secondary | ICD-10-CM | POA: Insufficient documentation

## 2014-12-12 DIAGNOSIS — M7989 Other specified soft tissue disorders: Secondary | ICD-10-CM

## 2014-12-12 LAB — COMPREHENSIVE METABOLIC PANEL
ALBUMIN: 3.5 g/dL (ref 3.5–5.0)
ALK PHOS: 78 U/L (ref 38–126)
ALT: 16 U/L (ref 14–54)
AST: 23 U/L (ref 15–41)
Anion gap: 6 (ref 5–15)
BUN: 19 mg/dL (ref 6–20)
CALCIUM: 9.4 mg/dL (ref 8.9–10.3)
CO2: 32 mmol/L (ref 22–32)
CREATININE: 0.75 mg/dL (ref 0.44–1.00)
Chloride: 101 mmol/L (ref 101–111)
GFR calc Af Amer: >60 mL/min (ref >60)
GFR calc non Af Amer: >60 mL/min (ref >60)
GLUCOSE: 107 mg/dL — AB (ref 65–99)
Potassium: 4.3 mmol/L (ref 3.5–5.1)
SODIUM: 139 mmol/L (ref 135–145)
Total Bilirubin: 0.4 mg/dL (ref 0.3–1.2)
Total Protein: 6.5 g/dL (ref 6.5–8.1)

## 2014-12-12 LAB — URINALYSIS COMPLETE WITH MICROSCOPIC (ARMC ONLY)
Bilirubin Urine: NEGATIVE
Glucose, UA: NEGATIVE mg/dL
Hgb urine dipstick: NEGATIVE
KETONES UR: NEGATIVE mg/dL
Nitrite: NEGATIVE
PROTEIN: NEGATIVE mg/dL
Specific Gravity, Urine: 1.009 (ref 1.005–1.030)
pH: 7 (ref 5.0–8.0)

## 2014-12-12 LAB — CBC
HEMATOCRIT: 32.1 % — AB (ref 35.0–47.0)
HEMOGLOBIN: 10.1 g/dL — AB (ref 12.0–16.0)
MCH: 26.1 pg (ref 26.0–34.0)
MCHC: 31.3 g/dL — AB (ref 32.0–36.0)
MCV: 83.4 fL (ref 80.0–100.0)
Platelets: 252 10*3/uL (ref 150–440)
RBC: 3.85 MIL/uL (ref 3.80–5.20)
RDW: 14.2 % (ref 11.5–14.5)
WBC: 5.7 10*3/uL (ref 3.6–11.0)

## 2014-12-12 LAB — BRAIN NATRIURETIC PEPTIDE: B Natriuretic Peptide: 68 pg/mL (ref 0.0–100.0)

## 2014-12-12 MED ORDER — OXYCODONE HCL 5 MG PO TABS
10.0000 mg | ORAL_TABLET | Freq: Once | ORAL | Status: AC
  Administered 2014-12-12: 10 mg via ORAL
  Filled 2014-12-12: qty 2

## 2014-12-12 NOTE — ED Notes (Signed)
Hip pain x 6 months, knee pain x 6 months. Hx of djd and was told this is affecting her rt hip. Had mri 11/05/14 on her back. States sob from abd feeling swollen.

## 2014-12-12 NOTE — ED Notes (Signed)
MD Gayle at bedside. 

## 2014-12-12 NOTE — ED Notes (Signed)
Pt c/o left knee pain for about 6 months; surgery to same about 1 1/2 years ago but started causing her increased pain around April; knee began swelling Saturday morning; also having right low back pain with frequency for about 6 months also; recently began having dysuria; pt in no acute distress

## 2014-12-12 NOTE — ED Provider Notes (Addendum)
[facility] Emergency Department Provider Note  ____________________________________________  Time seen: Approximately 11:03 PM  I have reviewed the triage vital signs and the nursing notes.   HISTORY  Chief Complaint Knee Pain; Urinary Frequency; Back Pain; Leg Swelling; and Shortness of Breath    HPI Jenna Gutierrez is a 69 y.o. female history of hypertension, COPD, fibromyalgia, anemia presented for evaluation of multiple complaints. Her most salient complaint is of nearly 6 months of intermittent left knee pain and leg swelling. She reports she had a knee replacement last year and since that time she has had intermittent swelling. It has flared up over the past 2-3 days. She denies any trauma. She reports she usually takes oxycodone for this however she does not currently have that medication. Her pain is moderate and worse with movement. It had a gradual onset. She is also complaining of right lumbar back/hip pain ongoing for the past 6 months. She had an MRI on 11/05/2014 of the lumbar spine showing some degenerative disc disease but no concern for cauda equina. She has a history of bladder tacking and has had "bladder issues" for several years. She reports that were recently she is having to urinate more frequently. She denies any bowel or bladder incontinence. No numbness or weakness in the lower extremities, no fever. He is also complaining of some mild constant shortness of breath which is also been ongoing for greater than a year, it is not worse with exertion. She feels it may be secondary to abdominal swelling. She has no chest pain, no fevers. She did have mild cough today.   Past Medical History  Diagnosis Date  . Bradycardia   . Unspecified hypothyroidism   . 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 Diverticulosis   . MVP (mitral valve prolapse)   . COPD (chronic obstructive pulmonary disease)   . Asthma   . Narcolepsy   . Arthritis   . Hypertension   . Asthma    . Emphysema of lung   . Anemia   . GERD (gastroesophageal reflux disease)   . IBS (irritable bowel syndrome)     Patient Active Problem List   Diagnosis Date Noted  . EOSINOPHILIC ESOPHAGITIS 54/65/0354  . ANEMIA, IRON DEFICIENCY 11/22/2009  . OTHER DYSPHAGIA 11/22/2009  . HEPATOMEGALY 11/22/2009  . NONSPECIFIC ABN FINDING RAD & OTH EXAM GI TRACT 11/30/2008  . UNSPECIFIED HYPOTHYROIDISM 08/25/2008  . BRADYCARDIA 08/25/2008  . FATIGUE 08/25/2008  . ABDOMINAL PAIN 08/25/2008  . GERD 05/17/2008  . CONSTIPATION 05/17/2008  . EPIGASTRIC PAIN 05/17/2008    Past Surgical History  Procedure Laterality Date  . Appendectomy  1973  . Hysterectomy (other)  1976  . Cholecystectomy  1980  . Back and neck  1994  . Bladder tack  2001  . Brain surgery  2008  . Cataracts  2009  . Carpel tunnel  2001  . Abdominal hernia repair  2008    Dr. Rise Patience  . Benign liver tumor removal    . Bunion surgerty    . Appendectomy    . Abdominal hysterectomy    . Cholecystectomy    . Eye surgery    . Back surgery      Current Outpatient Rx  Name  Route  Sig  Dispense  Refill  . acetaminophen-codeine (TYLENOL #3) 300-30 MG per tablet   Oral   Take 1 tablet by mouth every 6 (six) hours as needed for moderate pain.   20 tablet  0   . albuterol (PROAIR HFA) 108 (90 BASE) MCG/ACT inhaler   Inhalation   Inhale 2 puffs into the lungs every 6 (six) hours as needed.           Marland Kitchen albuterol (PROVENTIL HFA;VENTOLIN HFA) 108 (90 BASE) MCG/ACT inhaler   Inhalation   Inhale into the lungs every 6 (six) hours as needed for wheezing or shortness of breath.         . Armodafinil (NUVIGIL) 250 MG tablet   Oral   Take 250 mg by mouth daily.           . cholecalciferol (VITAMIN D) 1000 UNITS tablet   Oral   Take 1,000 Units by mouth daily.           Marland Kitchen dexlansoprazole (DEXILANT) 60 MG capsule   Oral   Take 60 mg by mouth daily.         Marland Kitchen dexlansoprazole (KAPIDEX) 60 MG capsule   Oral    Take 60 mg by mouth daily. 20 min before breakfast          . DULoxetine (CYMBALTA) 30 MG capsule   Oral   Take 30 mg by mouth daily.         . fluticasone (FLONASE) 50 MCG/ACT nasal spray   Nasal   Place 1 spray into the nose 2 (two) times daily.           . fluticasone (FLONASE) 50 MCG/ACT nasal spray   Each Nare   Place into both nostrils daily.         . fluticasone (FLOVENT HFA) 220 MCG/ACT inhaler   Inhalation   Inhale 2 puffs into the lungs 2 (two) times daily. Swallow the puffs, then sip small amount of water, NO food/drink for 30 min. NO SPACER NEEDEd          . levothyroxine (LEVO-T) 75 MCG tablet   Oral   Take 75 mcg by mouth daily before breakfast.         . levothyroxine (LEVOTHROID) 50 MCG tablet   Oral   Take 50 mcg by mouth daily.           Marland Kitchen lubiprostone (AMITIZA) 24 MCG capsule   Oral   Take 24 mcg by mouth 2 (two) times daily with a meal.         . methylPREDNISolone (MEDROL DOSEPAK) 4 MG TBPK tablet      Take as directed   21 tablet   0   . modafinil (PROVIGIL) 200 MG tablet   Oral   Take 200 mg by mouth daily.         . NON FORMULARY      Domperidone 10mg : one by mouth 3 times a day          . Sodium Oxybate (XYREM) 500 MG/ML SOLN   Oral   Take by mouth as directed.           . sucralfate (CARAFATE) 1 G tablet   Oral   Take 1 g by mouth. Before each meal and at bedtim             Allergies Sulfa antibiotics  Family History  Problem Relation Age of Onset  . Heart failure Mother   . Esophageal cancer Sister   . Ovarian cancer Sister     Social History Social History  Substance Use Topics  . Smoking status: Never Smoker   . Smokeless tobacco: Never Used  . Alcohol Use:  No    Review of Systems Constitutional: No fever/chills Eyes: No visual changes. ENT: No sore throat. Cardiovascular: Denies chest pain. Respiratory: + shortness of breath. Gastrointestinal: No abdominal pain.  No nausea, no  vomiting.  No diarrhea.  No constipation. Genitourinary: Positive for increased urinary frequency. Musculoskeletal: Positive for right lumbar back pain/hip pain which is chronic. Skin: Negative for rash. Neurological: Negative for headaches, focal weakness or numbness.  10-point ROS otherwise negative.  ____________________________________________   PHYSICAL EXAM:  VITAL SIGNS: ED Triage Vitals  Enc Vitals Group     BP 12/12/14 2206 136/93 mmHg     Pulse Rate 12/12/14 2206 81     Resp 12/12/14 2206 22     Temp 12/12/14 2206 99.4 F (37.4 C)     Temp Source 12/12/14 2206 Oral     SpO2 12/12/14 2206 100 %     Weight 12/12/14 2206 154 lb (69.854 kg)     Height 12/12/14 2206 5\' 1"  (1.549 m)     Head Cir --      Peak Flow --      Pain Score 12/12/14 2201 10     Pain Loc --      Pain Edu? --      Excl. in [place]? --     Constitutional: Alert and oriented. Well appearing and in no acute distress. Eyes: Conjunctivae are normal. PERRL. EOMI. Head: Atraumatic. Nose: No congestion/rhinnorhea. Mouth/Throat: Mucous membranes are moist.  Oropharynx non-erythematous. Neck: No stridor.   Cardiovascular: Normal rate, regular rhythm. Grossly normal heart sounds.  Good peripheral circulation. Respiratory: Normal respiratory effort.  No retractions. Lungs CTAB. Gastrointestinal: Soft and nontender. No distention. Bowel sounds. Genitourinary: deferred Musculoskeletal: Well-healed surgical scar in the left anterior knee. There is moderate pitting edema of the left lower extremity, no erythema, no warmth. The patient has full passive range of motion in the left knee. 2+ DP pulses bilaterally. No midline T or L-spine tenderness to palpation. Moderate tenderness to palpation throughout the paraspinal muscles of the right lumbar spine. Neurologic:  Normal speech and language. No gross focal neurologic deficits are appreciated. 5 out of 5 strength in bilateral upper and lower extremities. Sensation  intact to light touch throughout. Strong dorsiflexion of the hallux toes bilaterally. Skin:  Skin is warm, dry and intact. No rash noted. Psychiatric: Mood and affect are normal. Speech and behavior are normal.  ____________________________________________   LABS (all labs ordered are listed, but only abnormal results are displayed)  Labs Reviewed  URINALYSIS COMPLETEWITH MICROSCOPIC ([facility] ONLY) - Abnormal; Notable for the following:    Color, Urine YELLOW (*)    APPearance HAZY (*)    Leukocytes, UA TRACE (*)    Bacteria, UA RARE (*)    Squamous Epithelial / LPF 0-5 (*)    All other components within normal limits  CBC - Abnormal; Notable for the following:    Hemoglobin 10.1 (*)    HCT 32.1 (*)    MCHC 31.3 (*)    All other components within normal limits  COMPREHENSIVE METABOLIC PANEL - Abnormal; Notable for the following:    Glucose, Bld 107 (*)    All other components within normal limits  BRAIN NATRIURETIC PEPTIDE   ____________________________________________  EKG  ED ECG REPORT I, Joanne Gavel, the attending physician, personally viewed and interpreted this ECG.   Date: 12/12/2014  EKG Time: 23:48  Rate: 68  Rhythm: normal sinus rhythm  Axis: normal  Intervals:none  ST&T Change: No acute ST  elevation. No Q waves. No T wave inversion.  ____________________________________________  RADIOLOGY  CXR  IMPRESSION: No acute cardiopulmonary abnormality.  Venous Doppler ultrasound left lower extremity  IMPRESSION: 1. No evidence of left lower extremity deep venous thrombosis. 2. Subcutaneous edema in the distal left lower extremity.  ____________________________________________   PROCEDURES  Procedure(s) performed: None  Critical Care performed: No  ____________________________________________   INITIAL IMPRESSION / ASSESSMENT AND PLAN / ED COURSE  Pertinent labs & imaging results that were available during my care of the patient were reviewed  by me and considered in my medical decision making (see chart for details).  Jenna Gutierrez is a 69 y.o. female history of hypertension, COPD, fibromyalgia, anemia presented for evaluation of multiple complaints. Her most salient complaint is of nearly 6 months of intermittent left knee pain and leg swelling. On exam, she is very well-appearing and in no acute distress. Vital signs stable, she is afebrile. His neurovascular intact in the left lower extremity that there is edema associated with the lower part of the leg. She has full range of motion passively at the left knee. Her exam is not consistent with a septic arthritis. We'll obtain Doppler ultrasound to rule out DVT though suspect this may just be intermittent peripheral edema which she is at risk for given altered anatomy due to  knee operation. Exam is not consistent with cauda equina and she had a reassuring MRI Lspine on 11/05/2014. Suspect her pain is related to sciatica/musculoskeletal pain. She has an intact neurological examination, no bowel or bladder incontinence. She is having some increased urinary frequency so will check a urinalysis and send culture. Her shortness of breath is vague. She has no chest pain, it is nonexertional, her EKG is reassuring. We will obtain chest x-ray. She has no hypoxia, no increased work of breathing, her lungs are clear to auscultation bilaterally. No chest pain. H&P not onsistent with ACS, or PE. We'll treat her pain with oxycodone here in the emergency department however discussed with her that she will not be discharged with narcotic medication and she'll need to follow-up with her primary care doctor for prescription refill.  ----------------------------------------- 1:07 AM on 12/13/2014 -----------------------------------------  Patient reports significant improvement of her pain at this time. Labs reviewed. CBC is notable for chronic stable anemia with hemoglobin 11.1. Normal CMP. BNP was ordered in  triage and is not elevated. CXR clear. Urinalysis consistent with possible mild urinary tract infection. Given her normal GFR, will treat with Macrobid. Doppler ultrasound negative for any acute DVT. We discussed use of compression stockings as well as elevation of the left leg to assist with leg swelling. We discussed return precautions and need for close PCP follow-up. She is comfortable with the discharge plan. She did admit to her nurse that she has filled out paperwork to be seen by a local pain clinic however she is frustrated because she knows that on the first visit she will not receive narcotics. She has been encouraged to seek care with the pain clinic anyway. ____________________________________________   FINAL CLINICAL IMPRESSION(S) / ED DIAGNOSES  Final diagnoses:  Left knee pain  Left leg swelling  UTI (lower urinary tract infection)      Joanne Gavel, MD 12/13/14 0110  Joanne Gavel, MD 12/13/14 0111  Joanne Gavel, MD 12/13/14 [phone]

## 2014-12-13 ENCOUNTER — Emergency Department: Payer: Medicare PPO

## 2014-12-13 MED ORDER — NITROFURANTOIN MONOHYD MACRO 100 MG PO CAPS: 100.0000 mg | ORAL_CAPSULE | Freq: Two times a day (BID) | ORAL | Status: AC

## 2014-12-13 MED ORDER — NITROFURANTOIN MONOHYD MACRO 100 MG PO CAPS
100.0000 mg | ORAL_CAPSULE | Freq: Once | ORAL | Status: AC
  Administered 2014-12-13: 100 mg via ORAL
  Filled 2014-12-13: qty 1

## 2014-12-13 NOTE — ED Notes (Signed)
Pt returned from US at this time.

## 2014-12-13 NOTE — ED Notes (Signed)
Pt at US at this time

## 2014-12-13 NOTE — ED Notes (Signed)
Lab called regarding urine culture add on

## 2014-12-14 ENCOUNTER — Ambulatory Visit: Payer: Medicare PPO | Admitting: Podiatry

## 2014-12-15 ENCOUNTER — Ambulatory Visit: Payer: Medicare PPO | Attending: Anesthesiology | Admitting: Anesthesiology

## 2014-12-15 VITALS — BP 141/65 | HR 68 | Temp 98.3°F | Resp 20

## 2014-12-15 DIAGNOSIS — M5441 Lumbago with sciatica, right side: Secondary | ICD-10-CM | POA: Diagnosis present

## 2014-12-16 ENCOUNTER — Other Ambulatory Visit: Payer: Self-pay | Admitting: Neurosurgery

## 2014-12-16 DIAGNOSIS — M47812 Spondylosis without myelopathy or radiculopathy, cervical region: Secondary | ICD-10-CM

## 2014-12-16 LAB — URINE CULTURE

## 2014-12-22 ENCOUNTER — Ambulatory Visit: Payer: Medicare PPO | Attending: Anesthesiology | Admitting: Anesthesiology

## 2014-12-22 ENCOUNTER — Encounter: Payer: Self-pay | Admitting: Anesthesiology

## 2014-12-22 VITALS — BP 131/69 | HR 62 | Temp 98.4°F | Resp 16 | Ht 61.0 in | Wt 154.0 lb

## 2014-12-22 DIAGNOSIS — M5136 Other intervertebral disc degeneration, lumbar region: Secondary | ICD-10-CM

## 2014-12-22 DIAGNOSIS — M5416 Radiculopathy, lumbar region: Secondary | ICD-10-CM

## 2014-12-22 DIAGNOSIS — M545 Low back pain, unspecified: Secondary | ICD-10-CM

## 2014-12-22 DIAGNOSIS — D509 Iron deficiency anemia, unspecified: Secondary | ICD-10-CM

## 2014-12-22 NOTE — Patient Instructions (Signed)
Do not take a flu shot 2 weeks before or after your injection. GENERAL RISKS AND COMPLICATIONS  What are the risk, side effects and possible complications? Generally speaking, most procedures are safe.  However, with any procedure there are risks, side effects, and the possibility of complications.  The risks and complications are dependent upon the sites that are lesioned, or the type of nerve block to be performed.  The closer the procedure is to the spine, the more serious the risks are.  Great care is taken when placing the radio frequency needles, block needles or lesioning probes, but sometimes complications can occur. 1. Infection: Any time there is an injection through the skin, there is a risk of infection.  This is why sterile conditions are used for these blocks.  There are four possible types of infection. 1. Localized skin infection. 2. Central Nervous System Infection-This can be in the form of Meningitis, which can be deadly. 3. Epidural Infections-This can be in the form of an epidural abscess, which can cause pressure inside of the spine, causing compression of the spinal cord with subsequent paralysis. This would require an emergency surgery to decompress, and there are no guarantees that the patient would recover from the paralysis. 4. Discitis-This is an infection of the intervertebral discs.  It occurs in about 1% of discography procedures.  It is difficult to treat and it may lead to surgery.        2. Pain: the needles have to go through skin and soft tissues, will cause soreness.       3. Damage to internal structures:  The nerves to be lesioned may be near blood vessels or    other nerves which can be potentially damaged.       4. Bleeding: Bleeding is more common if the patient is taking blood thinners such as  aspirin, Coumadin, Ticiid, Plavix, etc., or if he/she have some genetic predisposition  such as hemophilia. Bleeding into the spinal canal can cause compression of the  spinal  cord with subsequent paralysis.  This would require an emergency surgery to  decompress and there are no guarantees that the patient would recover from the  paralysis.       5. Pneumothorax:  Puncturing of a lung is a possibility, every time a needle is introduced in  the area of the chest or upper back.  Pneumothorax refers to free air around the  collapsed lung(s), inside of the thoracic cavity (chest cavity).  Another two possible  complications related to a similar event would include: Hemothorax and Chylothorax.   These are variations of the Pneumothorax, where instead of air around the collapsed  lung(s), you may have blood or chyle, respectively.       6. Spinal headaches: They may occur with any procedures in the area of the spine.       7. Persistent CSF (Cerebro-Spinal Fluid) leakage: This is a rare problem, but may occur  with prolonged intrathecal or epidural catheters either due to the formation of a fistulous  track or a dural tear.       8. Nerve damage: By working so close to the spinal cord, there is always a possibility of  nerve damage, which could be as serious as a permanent spinal cord injury with  paralysis.       9. Death:  Although rare, severe deadly allergic reactions known as "Anaphylactic  reaction" can occur to any of the medications used.  10. Worsening of the symptoms:  We can always make thing worse.  What are the chances of something like this happening? Chances of any of this occuring are extremely low.  By statistics, you have more of a chance of getting killed in a motor vehicle accident: while driving to the hospital than any of the above occurring .  Nevertheless, you should be aware that they are possibilities.  In general, it is similar to taking a shower.  Everybody knows that you can slip, hit your head and get killed.  Does that mean that you should not shower again?  Nevertheless always keep in mind that statistics do not mean anything if you happen  to be on the wrong side of them.  Even if a procedure has a 1 (one) in a 1,000,000 (million) chance of going wrong, it you happen to be that one..Also, keep in mind that by statistics, you have more of a chance of having something go wrong when taking medications.  Who should not have this procedure? If you are on a blood thinning medication (e.g. Coumadin, Plavix, see list of "Blood Thinners"), or if you have an active infection going on, you should not have the procedure.  If you are taking any blood thinners, please inform your physician.  How should I prepare for this procedure?  Do not eat or drink anything at least six hours prior to the procedure.  Bring a driver with you .  It cannot be a taxi.  Come accompanied by an adult that can drive you back, and that is strong enough to help you if your legs get weak or numb from the local anesthetic.  Take all of your medicines the morning of the procedure with just enough water to swallow them.  If you have diabetes, make sure that you are scheduled to have your procedure done first thing in the morning, whenever possible.  If you have diabetes, take only half of your insulin dose and notify our nurse that you have done so as soon as you arrive at the clinic.  If you are diabetic, but only take blood sugar pills (oral hypoglycemic), then do not take them on the morning of your procedure.  You may take them after you have had the procedure.  Do not take aspirin or any aspirin-containing medications, at least eleven (11) days prior to the procedure.  They may prolong bleeding.  Wear loose fitting clothing that may be easy to take off and that you would not mind if it got stained with Betadine or blood.  Do not wear any jewelry or perfume  Remove any nail coloring.  It will interfere with some of our monitoring equipment.  NOTE: Remember that this is not meant to be interpreted as a complete list of all possible complications.  Unforeseen  problems may occur.  BLOOD THINNERS The following drugs contain aspirin or other products, which can cause increased bleeding during surgery and should not be taken for 2 weeks prior to and 1 week after surgery.  If you should need take something for relief of minor pain, you may take acetaminophen which is found in Tylenol,m Datril, Anacin-3 and Panadol. It is not blood thinner. The products listed below are.  Do not take any of the products listed below in addition to any listed on your instruction sheet.  A.P.C or A.P.C with Codeine Codeine Phosphate Capsules #3 Ibuprofen Ridaura  ABC compound Congesprin Imuran rimadil  Advil Cope Indocin Robaxisal  Alka-Seltzer Effervescent Pain Reliever and Antacid Coricidin or Coricidin-D  Indomethacin Rufen  Alka-Seltzer plus Cold Medicine Cosprin Ketoprofen S-A-C Tablets  Anacin Analgesic Tablets or Capsules Coumadin Korlgesic Salflex  Anacin Extra Strength Analgesic tablets or capsules CP-2 Tablets Lanoril Salicylate  Anaprox Cuprimine Capsules Levenox Salocol  Anexsia-D Dalteparin Magan Salsalate  Anodynos Darvon compound Magnesium Salicylate Sine-off  Ansaid Dasin Capsules Magsal Sodium Salicylate  Anturane Depen Capsules Marnal Soma  APF Arthritis pain formula Dewitt's Pills Measurin Stanback  Argesic Dia-Gesic Meclofenamic Sulfinpyrazone  Arthritis Bayer Timed Release Aspirin Diclofenac Meclomen Sulindac  Arthritis pain formula Anacin Dicumarol Medipren Supac  Analgesic (Safety coated) Arthralgen Diffunasal Mefanamic Suprofen  Arthritis Strength Bufferin Dihydrocodeine Mepro Compound Suprol  Arthropan liquid Dopirydamole Methcarbomol with Aspirin Synalgos  ASA tablets/Enseals Disalcid Micrainin Tagament  Ascriptin Doan's Midol Talwin  Ascriptin A/D Dolene Mobidin Tanderil  Ascriptin Extra Strength Dolobid Moblgesic Ticlid  Ascriptin with Codeine Doloprin or Doloprin with Codeine Momentum Tolectin  Asperbuf Duoprin Mono-gesic Trendar   Aspergum Duradyne Motrin or Motrin IB Triminicin  Aspirin plain, buffered or enteric coated Durasal Myochrisine Trigesic  Aspirin Suppositories Easprin Nalfon Trillsate  Aspirin with Codeine Ecotrin Regular or Extra Strength Naprosyn Uracel  Atromid-S Efficin Naproxen Ursinus  Auranofin Capsules Elmiron Neocylate Vanquish  Axotal Emagrin Norgesic Verin  Azathioprine Empirin or Empirin with Codeine Normiflo Vitamin E  Azolid Emprazil Nuprin Voltaren  Bayer Aspirin plain, buffered or children's or timed BC Tablets or powders Encaprin Orgaran Warfarin Sodium  Buff-a-Comp Enoxaparin Orudis Zorpin  Buff-a-Comp with Codeine Equegesic Os-Cal-Gesic   Buffaprin Excedrin plain, buffered or Extra Strength Oxalid   Bufferin Arthritis Strength Feldene Oxphenbutazone   Bufferin plain or Extra Strength Feldene Capsules Oxycodone with Aspirin   Bufferin with Codeine Fenoprofen Fenoprofen Pabalate or Pabalate-SF   Buffets II Flogesic Panagesic   Buffinol plain or Extra Strength Florinal or Florinal with Codeine Panwarfarin   Buf-Tabs Flurbiprofen Penicillamine   Butalbital Compound Four-way cold tablets Penicillin   Butazolidin Fragmin Pepto-Bismol   Carbenicillin Geminisyn Percodan   Carna Arthritis Reliever Geopen Persantine   Carprofen Gold's salt Persistin   Chloramphenicol Goody's Phenylbutazone   Chloromycetin Haltrain Piroxlcam   Clmetidine heparin Plaquenil   Cllnoril Hyco-pap Ponstel   Clofibrate Hydroxy chloroquine Propoxyphen         Before stopping any of these medications, be sure to consult the physician who ordered them.  Some, such as Coumadin (Warfarin) are ordered to prevent or treat serious conditions such as "deep thrombosis", "pumonary embolisms", and other heart problems.  The amount of time that you may need off of the medication may also vary with the medication and the reason for which you were taking it.  If you are taking any of these medications, please make sure you  notify your pain physician before you undergo any procedures.         Epidural Steroid Injection Patient Information  Description: The epidural space surrounds the nerves as they exit the spinal cord.  In some patients, the nerves can be compressed and inflamed by a bulging disc or a tight spinal canal (spinal stenosis).  By injecting steroids into the epidural space, we can bring irritated nerves into direct contact with a potentially helpful medication.  These steroids act directly on the irritated nerves and can reduce swelling and inflammation which often leads to decreased pain.  Epidural steroids may be injected anywhere along the spine and from the neck to the low back depending upon the location of your pain.   After  numbing the skin with local anesthetic (like Novocaine), a small needle is passed into the epidural space slowly.  You may experience a sensation of pressure while this is being done.  The entire block usually last less than 10 minutes.  Conditions which may be treated by epidural steroids:   Low back and leg pain  Neck and arm pain  Spinal stenosis  Post-laminectomy syndrome  Herpes zoster (shingles) pain  Pain from compression fractures  Preparation for the injection:  1. Do not eat any solid food or dairy products within 6 hours of your appointment.  2. You may drink clear liquids up to 2 hours before appointment.  Clear liquids include water, black coffee, juice or soda.  No milk or cream please. 3. You may take your regular medication, including pain medications, with a sip of water before your appointment  Diabetics should hold regular insulin (if taken separately) and take 1/2 normal NPH dos the morning of the procedure.  Carry some sugar containing items with you to your appointment. 4. A driver must accompany you and be prepared to drive you home after your procedure.  5. Bring all your current medications with your. 6. An IV may be inserted and  sedation may be given at the discretion of the physician.   7. A blood pressure cuff, EKG and other monitors will often be applied during the procedure.  Some patients may need to have extra oxygen administered for a short period. 8. You will be asked to provide medical information, including your allergies, prior to the procedure.  We must know immediately if you are taking blood thinners (like Coumadin/Warfarin)  Or if you are allergic to IV iodine contrast (dye). We must know if you could possible be pregnant.  Possible side-effects:  Bleeding from needle site  Infection (rare, may require surgery)  Nerve injury (rare)  Numbness & tingling (temporary)  Difficulty urinating (rare, temporary)  Spinal headache ( a headache worse with upright posture)  Light -headedness (temporary)  Pain at injection site (several days)  Decreased blood pressure (temporary)  Weakness in arm/leg (temporary)  Pressure sensation in back/neck (temporary)  Call if you experience:  Fever/chills associated with headache or increased back/neck pain.  Headache worsened by an upright position.  New onset weakness or numbness of an extremity below the injection site  Hives or difficulty breathing (go to the emergency room)  Inflammation or drainage at the infection site  Severe back/neck pain  Any new symptoms which are concerning to you  Please note:  Although the local anesthetic injected can often make your back or neck feel good for several hours after the injection, the pain will likely return.  It takes 3-7 days for steroids to work in the epidural space.  You may not notice any pain relief for at least that one week.  If effective, we will often do a series of three injections spaced 3-6 weeks apart to maximally decrease your pain.  After the initial series, we generally will wait several months before considering a repeat injection of the same type.  If you have any questions, please  call [phone] [facility]

## 2014-12-22 NOTE — Progress Notes (Signed)
Subjective:    Patient ID: Jenna Gutierrez, female    DOB: 05/14/1945, 69 y.o.   MRN: [id number] This is a pleasant 69 year old lady who comes in presenting with chronic low back pain Her back pain radiates into her right hip and down into the thigh into the right calf. She has had this pain for the past 2 years and is not associated with any trauma Patient indicated that she had lumbar spine surgery in 1994 MRI of the lumbar spine at that time showed multiple disc bulges She has not had any prior treatment for her back pain  Subjective pain intensity rating Subjective pain intensity rating is 85% Pain is relieved by oxycodone and it is aggravated by activities especially walking  Pain medication Was Fahrner takes Tylenol No. 3 which is prescribed to her by her primary care physician She had previously taken oxycodone before  Other medications Other medications include Cymbalta levo thyroxine Amitiza modavil Provigil  Allergies Vision is allergic to sulfa which produces E's infection in her  Past medical history Patient has had this severe anemia probably and deficiency anemia for a long time  Past surgical history Past surgical history is positive for appendectomy total abdominal hysterectomy for prolapsed uterus cholecystectomy excision of tumor of the liver and 1 back surgery . He is also had left knee surgery left foot surgery neck surgery and brain surgery   Social and economic history  Patient used to smoke half pack of cigarettes a day she did that for 5 years but she stopped smoking 9 years ago  She does not use alcohol  She does not use illicit drugs  She is a retired as a Oncologist  This patient was married initially but her husband died from cancer  He is now on her second marriage which has lasted for 5 years  She is para 4+1  She's had 2 living children ages 58 and 60 she's had 1 miscarriage and 2 overturn undyed as infant its 2 months and 2 hours of  age.  Imaging  She's had an MRI of her lumbar spine but these results are not available to me.  HPI    Review of Systems  Constitutional: Negative.  Negative for fever, chills, diaphoresis, activity change, appetite change, fatigue and unexpected weight change.  HENT: Negative.  Negative for congestion, dental problem, drooling, ear discharge, ear pain, facial swelling, hearing loss, mouth sores, nosebleeds, postnasal drip, rhinorrhea, sinus pressure, sneezing, sore throat, tinnitus, trouble swallowing and voice change.   Eyes: Negative.  Negative for photophobia, pain, discharge, redness, itching and visual disturbance.  Respiratory: Negative.  Negative for apnea, cough, choking, chest tightness, shortness of breath, wheezing and stridor.   Cardiovascular: Negative.  Negative for chest pain, palpitations and leg swelling.  Gastrointestinal: Negative.  Negative for nausea, vomiting, abdominal pain, diarrhea, constipation, blood in stool, abdominal distention, anal bleeding and rectal pain.  Endocrine: Negative.  Negative for cold intolerance, heat intolerance, polydipsia, polyphagia and polyuria.  Genitourinary: Negative.  Negative for dysuria, urgency, frequency, hematuria, flank pain, decreased urine volume, enuresis, difficulty urinating, genital sores, menstrual problem, pelvic pain and dyspareunia.  Musculoskeletal: Positive for myalgias, back pain, arthralgias and gait problem. Negative for joint swelling, neck pain and neck stiffness.  Skin: Negative.  Negative for color change, pallor, rash and wound.  Allergic/Immunologic: Negative.  Negative for environmental allergies, food allergies and immunocompromised state.  Neurological: Negative for dizziness, tremors, seizures, syncope, facial asymmetry, speech difficulty, weakness, light-headedness, numbness and  headaches.  Hematological: Negative for adenopathy. Does not bruise/bleed easily.       She's had severe anemia probably iron  deficiency anemia for along time  Psychiatric/Behavioral: Negative.        Objective:   Physical Exam  Constitutional: She is oriented to person, place, and time. She appears well-developed and well-nourished. No distress.  Patient looks moderately anemic  HENT:  Head: Normocephalic and atraumatic.  Right Ear: External ear normal.  Nose: Nose normal.  Mouth/Throat: No oropharyngeal exudate.  Eyes: Conjunctivae and EOM are normal. Pupils are equal, round, and reactive to light. Right eye exhibits no discharge. Left eye exhibits no discharge. No scleral icterus.  Neck: Normal range of motion. Neck supple. No JVD present. No tracheal deviation present. No thyromegaly present.  Cardiovascular: Normal rate, regular rhythm, normal heart sounds and intact distal pulses.  Exam reveals no gallop and no friction rub.   No murmur heard. Pulmonary/Chest: Effort normal and breath sounds normal. No respiratory distress. She has no wheezes. She has no rales. She exhibits no tenderness.  Abdominal: Soft. Bowel sounds are normal. She exhibits no distension and no mass. There is no tenderness. There is no rebound and no guarding.  Genitourinary:  Genitourinary examination was deferred  Musculoskeletal: She exhibits no edema or tenderness.  Lymphadenopathy:    She has no cervical adenopathy.  Neurological: She is alert and oriented to person, place, and time. She displays normal reflexes. No cranial nerve deficit. She exhibits normal muscle tone. Coordination normal.  Range of motion was slightly decreased in the lower extremities Leg raising test on the right side was 60 Straight leg raising test on the left side was 80 Torsion test was positive with a greater emphasis on the right side  Skin: Skin is warm. No rash noted. She is diaphoretic. No erythema. No pallor.  Psychiatric: She has a normal mood and affect. Her behavior is normal. Judgment and thought content normal.  Nursing note and vitals  reviewed.         Assessment & Plan:   Assessment 1 chronic low back pain 2 lumbar degenerative disc disease 3 right lumbar radiculopathy  4 severe anemia  [place] management 1 diagnostic caudal epidural steroid injection 2 review prior  lumbar MRIs 3 consider right lumbar transforaminal epidural steroid injection at L3 L4-L5 4 continue Tylenol No. 3 as prescribed by her primary care physician 5 Will consider given her tramadol in the future We'll plan to perform a caudal epidural steroid injection for her next week   New patient     level [place].D.

## 2014-12-22 NOTE — Progress Notes (Signed)
Safety precautions to be maintained throughout the outpatient stay will include: orient to surroundings, keep bed in low position, maintain call bell within reach at all times, provide assistance with transfer out of bed and ambulation.  

## 2014-12-24 ENCOUNTER — Ambulatory Visit: Admission: RE | Admit: 2014-12-24 | Payer: Medicare PPO | Source: Ambulatory Visit

## 2015-01-04 ENCOUNTER — Telehealth: Payer: Self-pay

## 2015-01-04 NOTE — Telephone Encounter (Signed)
She is out of tramadol, needs refill. Pharmacy is [facility].  She said Dr. Idelia Salm told her to call when she was out and he would give her more.

## 2015-01-05 NOTE — Telephone Encounter (Signed)
Spoke with Dr. Idelia Salm, pt must come for appt. Patient notified, appt. Scheduled.

## 2015-01-12 ENCOUNTER — Other Ambulatory Visit: Payer: Self-pay | Admitting: *Deleted

## 2015-01-25 ENCOUNTER — Ambulatory Visit: Payer: Medicare PPO | Attending: Anesthesiology | Admitting: Anesthesiology

## 2015-01-25 ENCOUNTER — Encounter: Payer: Self-pay | Admitting: Anesthesiology

## 2015-01-25 VITALS — BP 128/70 | HR 69 | Temp 97.7°F | Resp 16 | Ht 61.0 in | Wt 154.0 lb

## 2015-01-25 DIAGNOSIS — M545 Low back pain, unspecified: Secondary | ICD-10-CM | POA: Insufficient documentation

## 2015-01-25 DIAGNOSIS — M5417 Radiculopathy, lumbosacral region: Secondary | ICD-10-CM

## 2015-01-25 DIAGNOSIS — G8929 Other chronic pain: Secondary | ICD-10-CM | POA: Insufficient documentation

## 2015-01-25 DIAGNOSIS — M5116 Intervertebral disc disorders with radiculopathy, lumbar region: Secondary | ICD-10-CM | POA: Insufficient documentation

## 2015-01-25 DIAGNOSIS — M5137 Other intervertebral disc degeneration, lumbosacral region: Secondary | ICD-10-CM | POA: Insufficient documentation

## 2015-01-25 NOTE — Progress Notes (Signed)
Safety precautions to be maintained throughout the outpatient stay will include: orient to surroundings, keep bed in low position, maintain call bell within reach at all times, provide assistance with transfer out of bed and ambulation.  

## 2015-01-25 NOTE — Patient Instructions (Signed)
Epidural Steroid Injection Patient Information  Description: The epidural space surrounds the nerves as they exit the spinal cord.  In some patients, the nerves can be compressed and inflamed by a bulging disc or a tight spinal canal (spinal stenosis).  By injecting steroids into the epidural space, we can bring irritated nerves into direct contact with a potentially helpful medication.  These steroids act directly on the irritated nerves and can reduce swelling and inflammation which often leads to decreased pain.  Epidural steroids may be injected anywhere along the spine and from the neck to the low back depending upon the location of your pain.   After numbing the skin with local anesthetic (like Novocaine), a small needle is passed into the epidural space slowly.  You may experience a sensation of pressure while this is being done.  The entire block usually last less than 10 minutes.  Conditions which may be treated by epidural steroids:   Low back and leg pain  Neck and arm pain  Spinal stenosis  Post-laminectomy syndrome  Herpes zoster (shingles) pain  Pain from compression fractures  Preparation for the injection:  1. Do not eat any solid food or dairy products within 6 hours of your appointment.  2. You may drink clear liquids up to 2 hours before appointment.  Clear liquids include water, black coffee, juice or soda.  No milk or cream please. 3. You may take your regular medication, including pain medications, with a sip of water before your appointment  Diabetics should hold regular insulin (if taken separately) and take 1/2 normal NPH dos the morning of the procedure.  Carry some sugar containing items with you to your appointment. 4. A driver must accompany you and be prepared to drive you home after your procedure.  5. Bring all your current medications with your. 6. An IV may be inserted and sedation may be given at the discretion of the physician.   7. A blood pressure  cuff, EKG and other monitors will often be applied during the procedure.  Some patients may need to have extra oxygen administered for a short period. 8. You will be asked to provide medical information, including your allergies, prior to the procedure.  We must know immediately if you are taking blood thinners (like Coumadin/Warfarin)  Or if you are allergic to IV iodine contrast (dye). We must know if you could possible be pregnant.  Possible side-effects:  Bleeding from needle site  Infection (rare, may require surgery)  Nerve injury (rare)  Numbness & tingling (temporary)  Difficulty urinating (rare, temporary)  Spinal headache ( a headache worse with upright posture)  Light -headedness (temporary)  Pain at injection site (several days)  Decreased blood pressure (temporary)  Weakness in arm/leg (temporary)  Pressure sensation in back/neck (temporary)  Call if you experience:  Fever/chills associated with headache or increased back/neck pain.  Headache worsened by an upright position.  New onset weakness or numbness of an extremity below the injection site  Hives or difficulty breathing (go to the emergency room)  Inflammation or drainage at the infection site  Severe back/neck pain  Any new symptoms which are concerning to you  Please note:  Although the local anesthetic injected can often make your back or neck feel good for several hours after the injection, the pain will likely return.  It takes 3-7 days for steroids to work in the epidural space.  You may not notice any pain relief for at least that one week.    If effective, we will often do a series of three injections spaced 3-6 weeks apart to maximally decrease your pain.  After the initial series, we generally will wait several months before considering a repeat injection of the same type.  If you have any questions, please call [phone] [facility] 

## 2015-01-25 NOTE — Progress Notes (Signed)
   Subjective:    Patient ID: Jenna Gutierrez, female    DOB: 1945/07/29, 69 y.o.   MRN: [id number]  HPI  As part returned to the clinic today indicating that she was being treated by her primary care physician for this severe bladder infection She is currently on antibiotics and feels that she would prefer to have her procedure deferred I concurred with her request and we'll plan to perform a caudal epidural steroid injection for her next Friday Today her subjective pain intensity rating is 70% She requests that I give her the medication which she was getting from her primary care physician and that was Tylenol No. 3 I suggested to her that I get a written note from her primary care physician that she would no longer be prescribing any narcotics for When she provides such a note I will take over writing her pain medications for her   Review of Systems  Constitutional: Negative.   HENT: Negative.   Eyes: Negative.   Respiratory: Negative.   Cardiovascular: Negative.   Gastrointestinal: Negative.   Endocrine: Negative.   Genitourinary: Negative.   Musculoskeletal: Positive for back pain, arthralgias and gait problem.       She continued to have chronic low back pain but there are no new neurological or musculoskeletal findings  Skin: Negative.   Allergic/Immunologic: Negative.   Neurological: Negative.   Hematological: Negative.   Psychiatric/Behavioral: Negative.        Objective:   Physical Exam  Cardiovascular:  This patient appears comfortable and is in no distress Pressure is 128/70 mmHg  Her pulse is 69 bpm  Equal and regular Heart sounds 1 and 2 were heard in all areas and there were no audible murmurs Temperature is 97.75F Respirations are 16 breaths per minute SPO2 was 98% Chest is clinically clear There are no adventitious sounds Abdomen is soft and nontender There is no palpable organomegaly There Is no significant lymphadenopathy Pupils are equal and  reactive Cranial nerves are intact There are no new neurological or musculoskeletal findings    Nursing note and vitals reviewed.         Assessment & Plan:   Assessment 1 chronic low back pain 2 lumbar degenerative disc disease 3 Lumbar radiculopathy   Plan of management 1 Will Will plan to give her pain medications after I receive her note from her primary care physician 2 Will plan a caudal epidural steroid injection for her on second of December 2006   Established patient   Level Pony M.D.

## 2015-02-04 ENCOUNTER — Ambulatory Visit: Payer: Medicare PPO | Attending: Anesthesiology | Admitting: Anesthesiology

## 2015-02-04 ENCOUNTER — Encounter: Payer: Self-pay | Admitting: Anesthesiology

## 2015-02-04 VITALS — BP 118/61 | HR 63 | Temp 98.5°F | Resp 16 | Ht 61.0 in | Wt 154.0 lb

## 2015-02-04 DIAGNOSIS — G8929 Other chronic pain: Secondary | ICD-10-CM | POA: Insufficient documentation

## 2015-02-04 DIAGNOSIS — M5116 Intervertebral disc disorders with radiculopathy, lumbar region: Secondary | ICD-10-CM | POA: Insufficient documentation

## 2015-02-04 DIAGNOSIS — M5137 Other intervertebral disc degeneration, lumbosacral region: Secondary | ICD-10-CM | POA: Diagnosis not present

## 2015-02-04 DIAGNOSIS — M5417 Radiculopathy, lumbosacral region: Secondary | ICD-10-CM

## 2015-02-04 DIAGNOSIS — M545 Low back pain, unspecified: Secondary | ICD-10-CM

## 2015-02-04 MED ORDER — MIDAZOLAM HCL 5 MG/5ML IJ SOLN
INTRAMUSCULAR | Status: AC
  Administered 2015-02-04: 2 mg
  Filled 2015-02-04: qty 5

## 2015-02-04 MED ORDER — FENTANYL CITRATE (PF) 100 MCG/2ML IJ SOLN
INTRAMUSCULAR | Status: AC
  Administered 2015-02-04: 50 ug via INTRAVENOUS
  Filled 2015-02-04: qty 2

## 2015-02-04 MED ORDER — TRIAMCINOLONE ACETONIDE 40 MG/ML IJ SUSP
INTRAMUSCULAR | Status: AC
  Administered 2015-02-04: 12:00:00
  Filled 2015-02-04: qty 2

## 2015-02-04 MED ORDER — IOHEXOL 180 MG/ML  SOLN
INTRAMUSCULAR | Status: AC
  Administered 2015-02-04: 12:00:00
  Filled 2015-02-04: qty 20

## 2015-02-04 MED ORDER — BUPIVACAINE HCL (PF) 0.25 % IJ SOLN
INTRAMUSCULAR | Status: AC
  Administered 2015-02-04: 12:00:00
  Filled 2015-02-04: qty 30

## 2015-02-04 MED ORDER — HYDROCODONE-IBUPROFEN 7.5-200 MG PO TABS: 1.0000 | ORAL_TABLET | Freq: Three times a day (TID) | ORAL | Status: DC | PRN

## 2015-02-04 NOTE — Progress Notes (Signed)
   Subjective:    Patient ID: Jenna Gutierrez, female    DOB: 15-Jul-1945, 69 y.o.   MRN: [id number]  HPI    Review of Systems     Objective:   Physical Exam        Assessment & Plan:   This patient brought in a letter from her primary care physician that the physician would not be  prescribing any further opioids for her Accordingly are prescribed for her Vicoprofen 7.5/325 one tablet 3 times a day when necessary I'll give her 30 tablets and had see her in 10 days  [place].D.

## 2015-02-04 NOTE — Procedures (Signed)
Date of procedure:  02/04/2015  Preoperative Diagnosis:  1 chronic low back pain 2 lumbar degenerative disc disease 3 lumbar radiculopathy  Postoperative Diagnosis:  Same.  Procedure: 1. Caudal epidural steroid injection, 2. Epidural with interpretation. 3. Fluoroscopic guidance.  Surgeon: Lance Bosch, MD  Anesthesia: MAC anesthesia by the nursing staff under my direction  Informed consent was obtained and the patient appeared to accept and understand the benefits and risks of this procedure.   Pre procedure comments:  None  Description of the Procedure:  The patient was taken to the operating room and placed in the prone position.   Intravenous sedation and MAC anesthesia was administered by the nursing staff under my direction. After appropriate sedation, the sacrococcygeal area was prepped with Betadine.   After adequate draping, the area between the sacral cornu was palpated and infiltrated with 3 cc of 1% Lidocaine.   An AP fluoroscopic view of the sacrum was visualized and a 17 gauge Tuohy needle was inserted in the midline at the angle of 45 degrees through the sacrococcygeal membrane.   After making contact with the bone, the needle was withdrawn and readvanced in horizontal position, into the caudal epidural space.  Epidurogram Study: One cc of Omnipaque 300 was injected through the needle and epidurogram was visualized in both the later and AP views. After injecting the contrast through the Tuohy needle the dye was observed to the concentrated in the region of S1 and S2 the spread was rather restricted in its cephalad direction. This seemed to be most pronounced on the right and less on the left but ultimately the spread was rather limited in all directions  Comments:   This procedure was performed using fluoroscopic guidance Fluoroscopic time was 0.2 minutes Number of fluoroscopic frames was 1  MG Y was 5.9 No catheter was used  Caudal Epidural Steroid  Injection:  Then 10 cc of 0.25% Bupivacaine and 80 mg of Kenalog were injected into the Caudal epidural space.   The needle was removed and adequate hemostasis was established.    The patient tolerated the procedure quite well and vital signs were stable.   There were no adverse effects.  Additional comments:    The patient was taken to the recovery room in satisfactory condition where the patient was observed and subsequently discharged home.   Will follow up in the clinic in the next week.   Lance Bosch M.D.

## 2015-02-07 ENCOUNTER — Telehealth: Payer: Self-pay | Admitting: *Deleted

## 2015-02-07 NOTE — Telephone Encounter (Signed)
Denies any complications from procedure on Friday.

## 2015-02-08 ENCOUNTER — Ambulatory Visit: Payer: Self-pay | Admitting: Urology

## 2015-02-11 ENCOUNTER — Ambulatory Visit: Payer: Self-pay | Admitting: Urology

## 2015-02-14 ENCOUNTER — Encounter: Payer: Self-pay | Admitting: Anesthesiology

## 2015-02-14 ENCOUNTER — Ambulatory Visit: Payer: Medicare PPO | Attending: Anesthesiology | Admitting: Anesthesiology

## 2015-02-14 VITALS — BP 126/63 | HR 87 | Temp 98.0°F | Resp 18 | Ht 61.0 in | Wt 154.0 lb

## 2015-02-14 DIAGNOSIS — G8929 Other chronic pain: Secondary | ICD-10-CM | POA: Insufficient documentation

## 2015-02-14 DIAGNOSIS — M5116 Intervertebral disc disorders with radiculopathy, lumbar region: Secondary | ICD-10-CM | POA: Insufficient documentation

## 2015-02-14 DIAGNOSIS — M545 Low back pain, unspecified: Secondary | ICD-10-CM

## 2015-02-14 DIAGNOSIS — M5417 Radiculopathy, lumbosacral region: Secondary | ICD-10-CM

## 2015-02-14 DIAGNOSIS — M5137 Other intervertebral disc degeneration, lumbosacral region: Secondary | ICD-10-CM

## 2015-02-14 MED ORDER — HYDROCODONE-IBUPROFEN 7.5-200 MG PO TABS: 1.0000 | ORAL_TABLET | Freq: Three times a day (TID) | ORAL | Status: DC | PRN

## 2015-02-14 NOTE — Progress Notes (Signed)
Safety precautions to be maintained throughout the outpatient stay will include: orient to surroundings, keep bed in low position, maintain call bell within reach at all times, provide assistance with transfer out of bed and ambulation.  

## 2015-02-14 NOTE — Patient Instructions (Signed)
Pain Management Discharge Instructions  General Discharge Instructions :  If you need to reach your doctor call: Monday-Friday 8:00 am - 4:00 pm at [phone] or toll free [phone].  After clinic hours [phone] to have operator reach doctor.  Bring all of your medication bottles to all your appointments in the pain clinic.  To cancel or reschedule your appointment with Pain Management please remember to call 24 hours in advance to avoid a fee.  Refer to the educational materials which you have been given on: General Risks, I had my Procedure. Discharge Instructions, Post Sedation.  Post Procedure Instructions:  The drugs you were given will stay in your system until tomorrow, so for the next 24 hours you should not drive, make any legal decisions or drink any alcoholic beverages.  You may eat anything you prefer, but it is better to start with liquids then soups and crackers, and gradually work up to solid foods.  Please notify your doctor immediately if you have any unusual bleeding, trouble breathing or pain that is not related to your normal pain.  Depending on the type of procedure that was done, some parts of your body may feel week and/or numb.  This usually clears up by tonight or the next day.  Walk with the use of an assistive device or accompanied by an adult for the 24 hours.  You may use ice on the affected area for the first 24 hours.  Put ice in a Ziploc bag and cover with a towel and place against area 15 minutes on 15 minutes off.  You may switch to heat after 24 hours.GENERAL RISKS AND COMPLICATIONS  What are the risk, side effects and possible complications? Generally speaking, most procedures are safe.  However, with any procedure there are risks, side effects, and the possibility of complications.  The risks and complications are dependent upon the sites that are lesioned, or the type of nerve block to be performed.  The closer the procedure is to the spine,  the more serious the risks are.  Great care is taken when placing the radio frequency needles, block needles or lesioning probes, but sometimes complications can occur. 1. Infection: Any time there is an injection through the skin, there is a risk of infection.  This is why sterile conditions are used for these blocks.  There are four possible types of infection. 1. Localized skin infection. 2. Central Nervous System Infection-This can be in the form of Meningitis, which can be deadly. 3. Epidural Infections-This can be in the form of an epidural abscess, which can cause pressure inside of the spine, causing compression of the spinal cord with subsequent paralysis. This would require an emergency surgery to decompress, and there are no guarantees that the patient would recover from the paralysis. 4. Discitis-This is an infection of the intervertebral discs.  It occurs in about 1% of discography procedures.  It is difficult to treat and it may lead to surgery.        2. Pain: the needles have to go through skin and soft tissues, will cause soreness.       3. Damage to internal structures:  The nerves to be lesioned may be near blood vessels or    other nerves which can be potentially damaged.       4. Bleeding: Bleeding is more common if the patient is taking blood thinners such as  aspirin, Coumadin, Ticiid, Plavix, etc., or if he/she have some genetic predisposition  such as   hemophilia. Bleeding into the spinal canal can cause compression of the spinal  cord with subsequent paralysis.  This would require an emergency surgery to  decompress and there are no guarantees that the patient would recover from the  paralysis.       5. Pneumothorax:  Puncturing of a lung is a possibility, every time a needle is introduced in  the area of the chest or upper back.  Pneumothorax refers to free air around the  collapsed lung(s), inside of the thoracic cavity (chest cavity).  Another two possible  complications  related to a similar event would include: Hemothorax and Chylothorax.   These are variations of the Pneumothorax, where instead of air around the collapsed  lung(s), you may have blood or chyle, respectively.       6. Spinal headaches: They may occur with any procedures in the area of the spine.       7. Persistent CSF (Cerebro-Spinal Fluid) leakage: This is a rare problem, but may occur  with prolonged intrathecal or epidural catheters either due to the formation of a fistulous  track or a dural tear.       8. Nerve damage: By working so close to the spinal cord, there is always a possibility of  nerve damage, which could be as serious as a permanent spinal cord injury with  paralysis.       9. Death:  Although rare, severe deadly allergic reactions known as "Anaphylactic  reaction" can occur to any of the medications used.      10. Worsening of the symptoms:  We can always make thing worse.  What are the chances of something like this happening? Chances of any of this occuring are extremely low.  By statistics, you have more of a chance of getting killed in a motor vehicle accident: while driving to the hospital than any of the above occurring .  Nevertheless, you should be aware that they are possibilities.  In general, it is similar to taking a shower.  Everybody knows that you can slip, hit your head and get killed.  Does that mean that you should not shower again?  Nevertheless always keep in mind that statistics do not mean anything if you happen to be on the wrong side of them.  Even if a procedure has a 1 (one) in a 1,000,000 (million) chance of going wrong, it you happen to be that one..Also, keep in mind that by statistics, you have more of a chance of having something go wrong when taking medications.  Who should not have this procedure? If you are on a blood thinning medication (e.g. Coumadin, Plavix, see list of "Blood Thinners"), or if you have an active infection going on, you should not  have the procedure.  If you are taking any blood thinners, please inform your physician.  How should I prepare for this procedure?  Do not eat or drink anything at least six hours prior to the procedure.  Bring a driver with you .  It cannot be a taxi.  Come accompanied by an adult that can drive you back, and that is strong enough to help you if your legs get weak or numb from the local anesthetic.  Take all of your medicines the morning of the procedure with just enough water to swallow them.  If you have diabetes, make sure that you are scheduled to have your procedure done first thing in the morning, whenever possible.  If you have diabetes,   take only half of your insulin dose and notify our nurse that you have done so as soon as you arrive at the clinic.  If you are diabetic, but only take blood sugar pills (oral hypoglycemic), then do not take them on the morning of your procedure.  You may take them after you have had the procedure.  Do not take aspirin or any aspirin-containing medications, at least eleven (11) days prior to the procedure.  They may prolong bleeding.  Wear loose fitting clothing that may be easy to take off and that you would not mind if it got stained with Betadine or blood.  Do not wear any jewelry or perfume  Remove any nail coloring.  It will interfere with some of our monitoring equipment.  NOTE: Remember that this is not meant to be interpreted as a complete list of all possible complications.  Unforeseen problems may occur.  BLOOD THINNERS The following drugs contain aspirin or other products, which can cause increased bleeding during surgery and should not be taken for 2 weeks prior to and 1 week after surgery.  If you should need take something for relief of minor pain, you may take acetaminophen which is found in Tylenol,m Datril, Anacin-3 and Panadol. It is not blood thinner. The products listed below are.  Do not take any of the products listed below  in addition to any listed on your instruction sheet.  A.P.C or A.P.C with Codeine Codeine Phosphate Capsules #3 Ibuprofen Ridaura  ABC compound Congesprin Imuran rimadil  Advil Cope Indocin Robaxisal  Alka-Seltzer Effervescent Pain Reliever and Antacid Coricidin or Coricidin-D  Indomethacin Rufen  Alka-Seltzer plus Cold Medicine Cosprin Ketoprofen S-A-C Tablets  Anacin Analgesic Tablets or Capsules Coumadin Korlgesic Salflex  Anacin Extra Strength Analgesic tablets or capsules CP-2 Tablets Lanoril Salicylate  Anaprox Cuprimine Capsules Levenox Salocol  Anexsia-D Dalteparin Magan Salsalate  Anodynos Darvon compound Magnesium Salicylate Sine-off  Ansaid Dasin Capsules Magsal Sodium Salicylate  Anturane Depen Capsules Marnal Soma  APF Arthritis pain formula Dewitt's Pills Measurin Stanback  Argesic Dia-Gesic Meclofenamic Sulfinpyrazone  Arthritis Bayer Timed Release Aspirin Diclofenac Meclomen Sulindac  Arthritis pain formula Anacin Dicumarol Medipren Supac  Analgesic (Safety coated) Arthralgen Diffunasal Mefanamic Suprofen  Arthritis Strength Bufferin Dihydrocodeine Mepro Compound Suprol  Arthropan liquid Dopirydamole Methcarbomol with Aspirin Synalgos  ASA tablets/Enseals Disalcid Micrainin Tagament  Ascriptin Doan's Midol Talwin  Ascriptin A/D Dolene Mobidin Tanderil  Ascriptin Extra Strength Dolobid Moblgesic Ticlid  Ascriptin with Codeine Doloprin or Doloprin with Codeine Momentum Tolectin  Asperbuf Duoprin Mono-gesic Trendar  Aspergum Duradyne Motrin or Motrin IB Triminicin  Aspirin plain, buffered or enteric coated Durasal Myochrisine Trigesic  Aspirin Suppositories Easprin Nalfon Trillsate  Aspirin with Codeine Ecotrin Regular or Extra Strength Naprosyn Uracel  Atromid-S Efficin Naproxen Ursinus  Auranofin Capsules Elmiron Neocylate Vanquish  Axotal Emagrin Norgesic Verin  Azathioprine Empirin or Empirin with Codeine Normiflo Vitamin E  Azolid Emprazil Nuprin Voltaren  Bayer  Aspirin plain, buffered or children's or timed BC Tablets or powders Encaprin Orgaran Warfarin Sodium  Buff-a-Comp Enoxaparin Orudis Zorpin  Buff-a-Comp with Codeine Equegesic Os-Cal-Gesic   Buffaprin Excedrin plain, buffered or Extra Strength Oxalid   Bufferin Arthritis Strength Feldene Oxphenbutazone   Bufferin plain or Extra Strength Feldene Capsules Oxycodone with Aspirin   Bufferin with Codeine Fenoprofen Fenoprofen Pabalate or Pabalate-SF   Buffets II Flogesic Panagesic   Buffinol plain or Extra Strength Florinal or Florinal with Codeine Panwarfarin   Buf-Tabs Flurbiprofen Penicillamine   Butalbital Compound Four-way cold tablets   Penicillin   Butazolidin Fragmin Pepto-Bismol   Carbenicillin Geminisyn Percodan   Carna Arthritis Reliever Geopen Persantine   Carprofen Gold's salt Persistin   Chloramphenicol Goody's Phenylbutazone   Chloromycetin Haltrain Piroxlcam   Clmetidine heparin Plaquenil   Cllnoril Hyco-pap Ponstel   Clofibrate Hydroxy chloroquine Propoxyphen         Before stopping any of these medications, be sure to consult the physician who ordered them.  Some, such as Coumadin (Warfarin) are ordered to prevent or treat serious conditions such as "deep thrombosis", "pumonary embolisms", and other heart problems.  The amount of time that you may need off of the medication may also vary with the medication and the reason for which you were taking it.  If you are taking any of these medications, please make sure you notify your pain physician before you undergo any procedures.         Epidural Steroid Injection An epidural steroid injection is given to relieve pain in your neck, back, or legs that is caused by the irritation or swelling of a nerve root. This procedure involves injecting a steroid and numbing medicine (anesthetic) into the epidural space. The epidural space is the space between the outer covering of your spinal cord and the bones that form your backbone  (vertebra).  LET YOUR HEALTH CARE PROVIDER KNOW ABOUT:  2. Any allergies you have. 3. All medicines you are taking, including vitamins, herbs, eye drops, creams, and over-the-counter medicines such as aspirin. 4. Previous problems you or members of your family have had with the use of anesthetics. 5. Any blood disorders or blood clotting disorders you have. 6. Previous surgeries you have had. 7. Medical conditions you have. RISKS AND COMPLICATIONS Generally, this is a safe procedure. However, as with any procedure, complications can occur. Possible complications of epidural steroid injection include:  Headache.  Bleeding.  Infection.  Allergic reaction to the medicines.  Damage to your nerves. The response to this procedure depends on the underlying cause of the pain and its duration. People who have long-term (chronic) pain are less likely to benefit from epidural steroids than are those people whose pain comes on strong and suddenly. BEFORE THE PROCEDURE   Ask your health care provider about changing or stopping your regular medicines. You may be advised to stop taking blood-thinning medicines a few days before the procedure.  You may be given medicines to reduce anxiety.  Arrange for someone to take you home after the procedure. PROCEDURE   You will remain awake during the procedure. You may receive medicine to make you relaxed.  You will be asked to lie on your stomach.  The injection site will be cleaned.  The injection site will be numbed with a medicine (local anesthetic).  A needle will be injected through your skin into the epidural space.  Your health care provider will use an X-ray machine to ensure that the steroid is delivered closest to the affected nerve. You may have minimal discomfort at this time.  Once the needle is in the right position, the local anesthetic and the steroid will be injected into the epidural space.  The needle will then be removed and a  bandage will be applied to the injection site. AFTER THE PROCEDURE  12. You may be monitored for a short time before you go home. 13. You may feel weakness or numbness in your arm or leg, which disappears within hours. 14. You may be allowed to eat, drink, and take your regular   medicine. 15. You may have soreness at the site of the injection.   This information is not intended to replace advice given to you by your health care provider. Make sure you discuss any questions you have with your health care provider.   Document Released: 05/29/2007 Document Revised: 10/22/2012 Document Reviewed: 08/08/2012 [employer] Interactive Patient Education 2016 [employer]  

## 2015-02-18 NOTE — Progress Notes (Signed)
   Subjective:    Patient ID: Jenna Gutierrez, female    DOB: [birth year], 69 y.o.   MRN: [id number] This patient return to the clinic today indicating that she was doing very well He indicated that following theprocedure her subjective pain intensity rating decreased from 80% to 40% She indicates that her activities of daily living have improved significantlly She has been on Vicoprofen 7.5/203 times a day and as a consequence I would reduce that dose to one tablet twice a day She appears quite relaxed and is in no distress at all HPI    Review of Systems  Constitutional: Negative.   HENT: Negative.   Eyes: Negative.   Respiratory: Negative.   Cardiovascular: Negative.   Gastrointestinal: Negative.   Endocrine: Negative.   Genitourinary: Negative.   Musculoskeletal: Negative.   Skin: Negative.   Allergic/Immunologic: Negative.   Neurological: Negative.   Hematological: Negative.   Psychiatric/Behavioral: Negative.        Objective:   Physical Exam  Cardiovascular:  This patient appeared well-groomed and was in no distress Her blood pressure was 126/63 mmHg Her pulse was 87 bpm Equal and regular Heart sounds 1 and 2 were heard in all areas There were no audible murmurs Temperature was 11F Respirations were 18 breaths per minute min SPO2 was 98%  Respirations were 18 breaths per minute Chest was clinically clear There were no adventitious sounds Abdomen is soft and nontender There is no palpable organomegaly There is no significant lymphadenopathy Pupils are equal and reactive Cranial nerves are intact No new neurological or musculoskeletal findings  Nursing note and vitals reviewed.         Assessment & Plan:   Assessment 1 chronic low back pain To lumbar degenerative disc disease 3 lumbar radiculopathy   Plan of management 1 we will continue to encourage the patient to remain active 2 we will decrease her Vicoprofen 7.5/200-1 tablet twice a day and  will give her 60 tablets 3 we will plan to perform a caudal epidural injection for her at the next visit    Established patient      Level  Be[place] M.D.

## 2015-02-24 ENCOUNTER — Encounter: Payer: Self-pay | Admitting: Anesthesiology

## 2015-03-09 ENCOUNTER — Encounter: Payer: Self-pay | Admitting: Urology

## 2015-03-09 ENCOUNTER — Ambulatory Visit (INDEPENDENT_AMBULATORY_CARE_PROVIDER_SITE_OTHER): Payer: Medicare PPO | Admitting: Urology

## 2015-03-09 VITALS — BP 114/70 | HR 76 | Temp 99.1°F | Ht 61.0 in | Wt 173.8 lb

## 2015-03-09 DIAGNOSIS — N952 Postmenopausal atrophic vaginitis: Secondary | ICD-10-CM | POA: Diagnosis not present

## 2015-03-09 DIAGNOSIS — N3000 Acute cystitis without hematuria: Secondary | ICD-10-CM

## 2015-03-09 DIAGNOSIS — N8110 Cystocele, unspecified: Secondary | ICD-10-CM | POA: Diagnosis not present

## 2015-03-09 DIAGNOSIS — N390 Urinary tract infection, site not specified: Secondary | ICD-10-CM | POA: Insufficient documentation

## 2015-03-09 DIAGNOSIS — IMO0002 Reserved for concepts with insufficient information to code with codable children: Principal | ICD-10-CM

## 2015-03-09 DIAGNOSIS — F1910 Other psychoactive substance abuse, uncomplicated: Secondary | ICD-10-CM | POA: Diagnosis not present

## 2015-03-09 DIAGNOSIS — IMO0001 Reserved for inherently not codable concepts without codable children: Secondary | ICD-10-CM

## 2015-03-09 DIAGNOSIS — Z765 Malingerer [conscious simulation]: Secondary | ICD-10-CM

## 2015-03-09 LAB — BLADDER SCAN AMB NON-IMAGING: SCAN RESULT: 152

## 2015-03-09 LAB — URINALYSIS, COMPLETE
Bilirubin, UA: NEGATIVE
Glucose, UA: NEGATIVE
Ketones, UA: NEGATIVE
Nitrite, UA: POSITIVE — AB
Protein, UA: NEGATIVE
Specific Gravity, UA: 1.01 (ref 1.005–1.030)
Urobilinogen, Ur: 0.2 mg/dL (ref 0.2–1.0)
pH, UA: 7 (ref 5.0–7.5)

## 2015-03-09 LAB — MICROSCOPIC EXAMINATION: RBC, UA: NONE SEEN /hpf (ref 0–?)

## 2015-03-09 MED ORDER — AMOXICILLIN-POT CLAVULANATE 875-125 MG PO TABS: 1.0000 | ORAL_TABLET | Freq: Two times a day (BID) | ORAL | Status: DC

## 2015-03-09 NOTE — Progress Notes (Signed)
Bladder Scan Patient void: 197ml Performed By: Larna Daughters

## 2015-03-09 NOTE — Progress Notes (Signed)
03/09/2015 10:50 AM   Jenna Gutierrez 07/03/1945 [id number]  Referring provider: Kasandra Knudsen, NP [address] [place], [place] [address]  Chief Complaint  Patient presents with  . New Patient (Initial Visit)    recurrent UTI, urinary frequency    HPI: Patient is a 70 year old Caucasian female who presents today as a referral from [place], NP for urinary frequency and recurrent UTI's.    She states her symptoms began one month ago with the sudden onset of urinary urgency.  She is now experiencing frequency, dysuria, nocturia, incontinence, hesitancy and straining to urinate.  Her urine has been an odd color and smelling of sulfur.  She has not experienced any gross hematuria,  Her UA today is nitrite positive with many bacteria and 11-30 WBC's/hpf.  She has not had any recent fevers, chills or vomiting, but she has had a chronic mild nausea for the last month.  Her incontinence worsened one month ago and now she is wearing 10 pull ups daily.  She states her incontinence volume is large.  She loses urine when she strains and has the urge to urinate.  Her VR today is 152 mL.  She has had four bladder surgeries to tack her bladder up.  Her first two were in the '70's in [place] with Dr. Faythe Casa.  Her last two were in 2000 in [place].  She states the first surgeon was a man and the second surgeon was a woman.  She believes they were all urologists.  She describes each bladder surgery as using mesh and a harness.   Today, she feels her bladder between her legs.    She was seen at [facility] in [place] on 01/21/2015 and treated for an UTI with Cipro and Pyridium.  Her UA was positive for blood, but it was a dip.  A culture was ordered, but no results were seen in the referral notes.  Patient states she did not recall taking an antibiotic.    PMH: Past Medical History  Diagnosis Date  . Bradycardia   . Unspecified hypothyroidism   . 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  Diverticulosis   . MVP (mitral valve prolapse)   . COPD (chronic obstructive pulmonary disease) ([facility])   . Asthma   . Narcolepsy   . Arthritis   . Hypertension   . Asthma   . Emphysema of lung ([place])   . Anemia   . GERD (gastroesophageal reflux disease)   . IBS (irritable bowel syndrome)   . BRADYCARDIA 08/25/2008    Qualifier: Diagnosis of  By: Madilyn Fireman MD, [facility]      Surgical History: Past Surgical History  Procedure Laterality Date  . Appendectomy  1973  . Hysterectomy (other)  1976  . Cholecystectomy  1980  . Back and neck  1994  . Bladder tack  2001  . Brain surgery  2008  . Cataracts  2009  . Carpel tunnel  2001  . Abdominal hernia repair  2008    Dr. Rise Patience  . Benign liver tumor removal    . Bunion surgerty    . Appendectomy    . Abdominal hysterectomy    . Cholecystectomy    . Eye surgery    . Back surgery    . Bladder surgery      x4    Home Medications:    Medication List       This list is accurate as of:  03/09/15 10:50 AM.  Always use your most recent med list.               acetaminophen-codeine 300-30 MG tablet  Commonly known as:  TYLENOL #3  Take 1 tablet by mouth every 6 (six) hours as needed for moderate pain.     amoxicillin-clavulanate 875-125 MG tablet  Commonly known as:  AUGMENTIN  Take 1 tablet by mouth every 12 (twelve) hours.     cholecalciferol 1000 units tablet  Commonly known as:  VITAMIN D  Take 1,000 Units by mouth daily. Reported on 03/09/2015     DULoxetine 30 MG capsule  Commonly known as:  CYMBALTA  Take 30 mg by mouth daily. Reported on 03/09/2015     fluticasone 50 MCG/ACT nasal spray  Commonly known as:  FLONASE  Place into both nostrils daily. Reported on 03/09/2015     FLONASE 50 MCG/ACT nasal spray  Generic drug:  fluticasone  Place 1 spray into the nose 2 (two) times daily. Reported on 03/09/2015     FLOVENT HFA 220 MCG/ACT inhaler  Generic drug:  fluticasone  Inhale 2 puffs into the lungs 2 (two) times  daily. Reported on 03/09/2015     HYDROcodone-ibuprofen 7.5-200 MG tablet  Commonly known as:  VICOPROFEN  Take 1 tablet by mouth every 8 (eight) hours as needed for moderate pain.     HYDROcodone-ibuprofen 7.5-200 MG tablet  Commonly known as:  VICOPROFEN  Take 1 tablet by mouth every 8 (eight) hours as needed for moderate pain.     DEXILANT 60 MG capsule  Generic drug:  dexlansoprazole  Take 60 mg by mouth daily. Reported on 03/09/2015     KAPIDEX 60 MG capsule  Generic drug:  dexlansoprazole  Take 60 mg by mouth daily. 20 min before breakfast     LEVO-T 75 MCG tablet  Generic drug:  levothyroxine  Take 75 mcg by mouth daily before breakfast.     LEVOTHROID 50 MCG tablet  Generic drug:  levothyroxine  Take 50 mcg by mouth daily. Reported on 03/09/2015     lubiprostone 24 MCG capsule  Commonly known as:  AMITIZA  Take 24 mcg by mouth 2 (two) times daily with a meal.     methylPREDNISolone 4 MG Tbpk tablet  Commonly known as:  MEDROL DOSEPAK  Take as directed     modafinil 200 MG tablet  Commonly known as:  PROVIGIL  Take 200 mg by mouth daily. Reported on 03/09/2015     NON FORMULARY  Reported on 03/09/2015     NUVIGIL 250 MG tablet  Generic drug:  Armodafinil  Take 250 mg by mouth daily. Reported on 03/09/2015     albuterol 108 (90 Base) MCG/ACT inhaler  Commonly known as:  PROVENTIL HFA;VENTOLIN HFA  Inhale into the lungs every 6 (six) hours as needed for wheezing or shortness of breath. Reported on 03/09/2015     PROAIR HFA 108 (90 Base) MCG/ACT inhaler  Generic drug:  albuterol  Inhale 2 puffs into the lungs every 6 (six) hours as needed.     sucralfate 1 g tablet  Commonly known as:  CARAFATE  Take 1 g by mouth. Reported on 03/09/2015     XYREM 500 MG/ML Soln  Generic drug:  Sodium Oxybate  Take by mouth as directed. Reported on 03/09/2015        Allergies:  Allergies  Allergen Reactions  . Sulfa Antibiotics     Family History: Family History  Problem  Relation Age of Onset  . Heart failure Mother   . Esophageal cancer Sister   . Ovarian cancer Sister     Social History:  reports that she quit smoking about 8 years ago. She has never used smokeless tobacco. She reports that she does not drink alcohol or use illicit drugs.  ROS: UROLOGY Frequent Urination?: Yes Hard to postpone urination?: Yes Burning/pain with urination?: Yes Get up at night to urinate?: Yes Leakage of urine?: Yes Urine stream starts and stops?: Yes Trouble starting stream?: Yes Do you have to strain to urinate?: Yes Blood in urine?: No Urinary tract infection?: Yes Sexually transmitted disease?: No Injury to kidneys or bladder?: No Painful intercourse?: No Weak stream?: No Currently pregnant?: No Vaginal bleeding?: No Last menstrual period?: n  Gastrointestinal Nausea?: No Vomiting?: No Indigestion/heartburn?: Yes Diarrhea?: No Constipation?: Yes  Constitutional Fever: Yes Night sweats?: No Weight loss?: Yes Fatigue?: Yes  Skin Skin rash/lesions?: No Itching?: No  Eyes Blurred vision?: Yes Double vision?: Yes  Ears/Nose/Throat Sore throat?: No Sinus problems?: Yes  Hematologic/Lymphatic Swollen glands?: No Easy bruising?: No  Cardiovascular Leg swelling?: Yes Chest pain?: No  Respiratory Cough?: No Shortness of breath?: No  Endocrine Excessive thirst?: No  Musculoskeletal Back pain?: No Joint pain?: No  Neurological Headaches?: No Dizziness?: No  Psychologic Depression?: No Anxiety?: No  Physical Exam: BP 114/70 mmHg  Pulse 76  Temp(Src) 99.1 F (37.3 C) (Oral)  Ht 5\' 1"  (1.549 m)  Wt 173 lb 12.8 oz (78.835 kg)  BMI 32.86 kg/m2  Constitutional: Well nourished. Alert and oriented, No acute distress. HEENT: [place] AT, moist mucus membranes. Trachea midline, no masses. Cardiovascular: No clubbing, cyanosis, or edema. Respiratory: Normal respiratory effort, no increased work of breathing. GI: Abdomen is soft,  non tender, non distended, no abdominal masses. Liver and spleen not palpable.  No hernias appreciated.  Stool sample for occult testing is not indicated.   GU: No CVA tenderness.  No bladder fullness or masses.  Atrophic external genitalia, normal pubic hair distribution, no lesions.  Urethral caruncle is noted.    No bladder fullness or masses, but exquisitely tender upon palpation.  Atrophic vagina mucosa, poor estrogen effect, no discharge, no lesions, weak pelvic support, Grade II cystocele is noted.  No rectocele is noted, but her pelvic floor was exquisitely tender upon palpation.  Cervix and uterus are surgically absent.  No adnexal/parametria masses or tenderness noted.  Anus and perineum are without rashes or lesions.    Skin: No rashes, bruises or suspicious lesions. Lymph: No cervical or inguinal adenopathy. Neurologic: Grossly intact, no focal deficits, moving all 4 extremities. Psychiatric: Normal mood and affect.  Laboratory Data: Lab Results  Component Value Date   WBC 5.7 12/12/2014   HGB 10.1* 12/12/2014   HCT 32.1* 12/12/2014   MCV 83.4 12/12/2014   PLT 252 12/12/2014   Lab Results  Component Value Date   CREATININE 0.75 12/12/2014   Lab Results  Component Value Date   TSH 1.63 11/22/2009   Urinalysis Results for orders placed or performed in visit on 03/09/15  Microscopic Examination  Result Value Ref Range   WBC, UA 11-30 (A) 0 -  5 /hpf   RBC, UA None seen 0 -  2 /hpf   Epithelial Cells (non renal) 0-10 0 - 10 /hpf   Bacteria, UA Many (A) None seen/Few  Urinalysis, Complete  Result Value Ref Range   Specific Gravity, UA 1.010 1.005 - 1.030   pH, UA 7.0 5.0 -  7.5   Color, UA Yellow Yellow   Appearance Ur Cloudy (A) Clear   Leukocytes, UA 1+ (A) Negative   Protein, UA Negative Negative/Trace   Glucose, UA Negative Negative   Ketones, UA Negative Negative   RBC, UA Trace (A) Negative   Bilirubin, UA Negative Negative   Urobilinogen, Ur 0.2 0.2 - 1.0  mg/dL   Nitrite, UA Positive (A) Negative   Microscopic Examination See below:   Bladder Scan (Post Void Residual) in office  Result Value Ref Range   Scan Result 152     Pertinent Imaging: Results for BHAVANA, MCMANAMY (MRN [id number]) as of 03/09/2015 09:30  Ref. Range 03/09/2015 09:25  Scan Result Unknown 152    Assessment & Plan:    1. UTI:   Patient's UA was nitrite positive on today's exam.  I will send it for culture and will prescribe Augmentin 875/125 one tablet twice daily #14 as the culture results will most likely not be available until Monday.  We will adjust the antibiotic if appropriate when culture and sensitivities return.    - Urinalysis, Complete - Bladder Scan (Post Void Residual) in office  2. Cystocele:   Cystoceles do not typically cause pain, but it may be contributing to her incontinence.  Once her UTI has been treated, we will refer her to an academic center due to her multiple bladder surgeries for further management of her incontinence issues.  3. Vaginal atrophy:   Patient was given a sample of vaginal estrogen cream (Premarin) and instructed to apply 0.5mg  (pea-sized amount)  just inside the vaginal introitus with a finger-tip every night for two weeks and then Monday, Wednesday and Friday nights.  I explained to the patient that vaginally administered estrogen, which causes only a slight increase in the blood estrogen levels, have fewer contraindications and adverse systemic effects that oral HT.    4. Drug seeking behavior:   Patient's pain symptoms do not correlate with the exam.  Cystoceles typically do not cause pain and she stated the entire pelvic was exquisitely tender.  I did not discover any etiology during the exam that would potentially cause the pain.  She requested pain medication.  But when I offered a non-narcotic pain medication, she said, " just give me the antibiotic."   There is a letter from her PCP's office stating they would not prescribe her any  more narcotic pain medication and she is being seen at a pain clinic.  The patient did not volunteer this information to me.  I explained to the patient that I was aware of the letter and that she was a patient at the pain clinic.  I informed her that I would not be prescribing any narcotic pain medication and that she would have to contact her pain clinic for pain control.     Return in about 2 weeks (around 03/23/2015) for UA and exam.  Zara Council, [facility] [address] [place], [place] [address] [phone]

## 2015-03-11 ENCOUNTER — Telehealth: Payer: Self-pay | Admitting: Urology

## 2015-03-11 LAB — CULTURE, URINE COMPREHENSIVE

## 2015-03-11 NOTE — Telephone Encounter (Signed)
I contacted the patient concerning her antibiotic.

## 2015-03-15 ENCOUNTER — Ambulatory Visit: Payer: Medicare PPO | Attending: Anesthesiology | Admitting: Anesthesiology

## 2015-03-15 ENCOUNTER — Telehealth: Payer: Self-pay

## 2015-03-15 ENCOUNTER — Encounter: Payer: Self-pay | Admitting: Anesthesiology

## 2015-03-15 VITALS — BP 127/59 | HR 65 | Temp 98.8°F | Resp 16 | Ht 61.0 in | Wt 154.0 lb

## 2015-03-15 DIAGNOSIS — M5116 Intervertebral disc disorders with radiculopathy, lumbar region: Secondary | ICD-10-CM | POA: Insufficient documentation

## 2015-03-15 DIAGNOSIS — M5417 Radiculopathy, lumbosacral region: Secondary | ICD-10-CM | POA: Insufficient documentation

## 2015-03-15 DIAGNOSIS — M545 Low back pain, unspecified: Secondary | ICD-10-CM

## 2015-03-15 DIAGNOSIS — G8929 Other chronic pain: Secondary | ICD-10-CM | POA: Insufficient documentation

## 2015-03-15 DIAGNOSIS — M5137 Other intervertebral disc degeneration, lumbosacral region: Secondary | ICD-10-CM

## 2015-03-15 MED ORDER — MIDAZOLAM HCL 5 MG/5ML IJ SOLN
INTRAMUSCULAR | Status: AC
  Administered 2015-03-15: 12:00:00
  Filled 2015-03-15: qty 5

## 2015-03-15 MED ORDER — TRIAMCINOLONE ACETONIDE 40 MG/ML IJ SUSP
INTRAMUSCULAR | Status: AC
  Administered 2015-03-15: 12:00:00
  Filled 2015-03-15: qty 1

## 2015-03-15 MED ORDER — FENTANYL CITRATE (PF) 100 MCG/2ML IJ SOLN
INTRAMUSCULAR | Status: AC
  Administered 2015-03-15: 12:00:00
  Filled 2015-03-15: qty 2

## 2015-03-15 MED ORDER — HYDROCODONE-IBUPROFEN 7.5-200 MG PO TABS: 1.0000 | ORAL_TABLET | Freq: Two times a day (BID) | ORAL | Status: DC

## 2015-03-15 MED ORDER — IOHEXOL 180 MG/ML  SOLN
INTRAMUSCULAR | Status: AC
  Administered 2015-03-15: 12:00:00
  Filled 2015-03-15: qty 20

## 2015-03-15 MED ORDER — TRIAMCINOLONE ACETONIDE 40 MG/ML IJ SUSP
INTRAMUSCULAR | Status: AC
  Administered 2015-03-15: 12:00:00
  Filled 2015-03-15: qty 2

## 2015-03-15 MED ORDER — BUPIVACAINE HCL (PF) 0.25 % IJ SOLN
INTRAMUSCULAR | Status: AC
  Administered 2015-03-15: 12:00:00
  Filled 2015-03-15: qty 30

## 2015-03-15 NOTE — Patient Instructions (Signed)
Pain Management Discharge Instructions  General Discharge Instructions :  If you need to reach your doctor call: Monday-Friday 8:00 am - 4:00 pm at [phone] or toll free [phone].  After clinic hours [phone] to have operator reach doctor.  Bring all of your medication bottles to all your appointments in the pain clinic.  To cancel or reschedule your appointment with Pain Management please remember to call 24 hours in advance to avoid a fee.  Refer to the educational materials which you have been given on: General Risks, I had my Procedure. Discharge Instructions, Post Sedation.  Post Procedure Instructions:  The drugs you were given will stay in your system until tomorrow, so for the next 24 hours you should not drive, make any legal decisions or drink any alcoholic beverages.  You may eat anything you prefer, but it is better to start with liquids then soups and crackers, and gradually work up to solid foods.  Please notify your doctor immediately if you have any unusual bleeding, trouble breathing or pain that is not related to your normal pain.  Depending on the type of procedure that was done, some parts of your body may feel week and/or numb.  This usually clears up by tonight or the next day.  Walk with the use of an assistive device or accompanied by an adult for the 24 hours.  You may use ice on the affected area for the first 24 hours.  Put ice in a Ziploc bag and cover with a towel and place against area 15 minutes on 15 minutes off.  You may switch to heat after 24 hours.GENERAL RISKS AND COMPLICATIONS  What are the risk, side effects and possible complications? Generally speaking, most procedures are safe.  However, with any procedure there are risks, side effects, and the possibility of complications.  The risks and complications are dependent upon the sites that are lesioned, or the type of nerve block to be performed.  The closer the procedure is to the spine,  the more serious the risks are.  Great care is taken when placing the radio frequency needles, block needles or lesioning probes, but sometimes complications can occur. 1. Infection: Any time there is an injection through the skin, there is a risk of infection.  This is why sterile conditions are used for these blocks.  There are four possible types of infection. 1. Localized skin infection. 2. Central Nervous System Infection-This can be in the form of Meningitis, which can be deadly. 3. Epidural Infections-This can be in the form of an epidural abscess, which can cause pressure inside of the spine, causing compression of the spinal cord with subsequent paralysis. This would require an emergency surgery to decompress, and there are no guarantees that the patient would recover from the paralysis. 4. Discitis-This is an infection of the intervertebral discs.  It occurs in about 1% of discography procedures.  It is difficult to treat and it may lead to surgery.        2. Pain: the needles have to go through skin and soft tissues, will cause soreness.       3. Damage to internal structures:  The nerves to be lesioned may be near blood vessels or    other nerves which can be potentially damaged.       4. Bleeding: Bleeding is more common if the patient is taking blood thinners such as  aspirin, Coumadin, Ticiid, Plavix, etc., or if he/she have some genetic predisposition  such as   hemophilia. Bleeding into the spinal canal can cause compression of the spinal  cord with subsequent paralysis.  This would require an emergency surgery to  decompress and there are no guarantees that the patient would recover from the  paralysis.       5. Pneumothorax:  Puncturing of a lung is a possibility, every time a needle is introduced in  the area of the chest or upper back.  Pneumothorax refers to free air around the  collapsed lung(s), inside of the thoracic cavity (chest cavity).  Another two possible  complications  related to a similar event would include: Hemothorax and Chylothorax.   These are variations of the Pneumothorax, where instead of air around the collapsed  lung(s), you may have blood or chyle, respectively.       6. Spinal headaches: They may occur with any procedures in the area of the spine.       7. Persistent CSF (Cerebro-Spinal Fluid) leakage: This is a rare problem, but may occur  with prolonged intrathecal or epidural catheters either due to the formation of a fistulous  track or a dural tear.       8. Nerve damage: By working so close to the spinal cord, there is always a possibility of  nerve damage, which could be as serious as a permanent spinal cord injury with  paralysis.       9. Death:  Although rare, severe deadly allergic reactions known as "Anaphylactic  reaction" can occur to any of the medications used.      10. Worsening of the symptoms:  We can always make thing worse.  What are the chances of something like this happening? Chances of any of this occuring are extremely low.  By statistics, you have more of a chance of getting killed in a motor vehicle accident: while driving to the hospital than any of the above occurring .  Nevertheless, you should be aware that they are possibilities.  In general, it is similar to taking a shower.  Everybody knows that you can slip, hit your head and get killed.  Does that mean that you should not shower again?  Nevertheless always keep in mind that statistics do not mean anything if you happen to be on the wrong side of them.  Even if a procedure has a 1 (one) in a 1,000,000 (million) chance of going wrong, it you happen to be that one..Also, keep in mind that by statistics, you have more of a chance of having something go wrong when taking medications.  Who should not have this procedure? If you are on a blood thinning medication (e.g. Coumadin, Plavix, see list of "Blood Thinners"), or if you have an active infection going on, you should not  have the procedure.  If you are taking any blood thinners, please inform your physician.  How should I prepare for this procedure?  Do not eat or drink anything at least six hours prior to the procedure.  Bring a driver with you .  It cannot be a taxi.  Come accompanied by an adult that can drive you back, and that is strong enough to help you if your legs get weak or numb from the local anesthetic.  Take all of your medicines the morning of the procedure with just enough water to swallow them.  If you have diabetes, make sure that you are scheduled to have your procedure done first thing in the morning, whenever possible.  If you have diabetes,   take only half of your insulin dose and notify our nurse that you have done so as soon as you arrive at the clinic.  If you are diabetic, but only take blood sugar pills (oral hypoglycemic), then do not take them on the morning of your procedure.  You may take them after you have had the procedure.  Do not take aspirin or any aspirin-containing medications, at least eleven (11) days prior to the procedure.  They may prolong bleeding.  Wear loose fitting clothing that may be easy to take off and that you would not mind if it got stained with Betadine or blood.  Do not wear any jewelry or perfume  Remove any nail coloring.  It will interfere with some of our monitoring equipment.  NOTE: Remember that this is not meant to be interpreted as a complete list of all possible complications.  Unforeseen problems may occur.  BLOOD THINNERS The following drugs contain aspirin or other products, which can cause increased bleeding during surgery and should not be taken for 2 weeks prior to and 1 week after surgery.  If you should need take something for relief of minor pain, you may take acetaminophen which is found in Tylenol,m Datril, Anacin-3 and Panadol. It is not blood thinner. The products listed below are.  Do not take any of the products listed below  in addition to any listed on your instruction sheet.  A.P.C or A.P.C with Codeine Codeine Phosphate Capsules #3 Ibuprofen Ridaura  ABC compound Congesprin Imuran rimadil  Advil Cope Indocin Robaxisal  Alka-Seltzer Effervescent Pain Reliever and Antacid Coricidin or Coricidin-D  Indomethacin Rufen  Alka-Seltzer plus Cold Medicine Cosprin Ketoprofen S-A-C Tablets  Anacin Analgesic Tablets or Capsules Coumadin Korlgesic Salflex  Anacin Extra Strength Analgesic tablets or capsules CP-2 Tablets Lanoril Salicylate  Anaprox Cuprimine Capsules Levenox Salocol  Anexsia-D Dalteparin Magan Salsalate  Anodynos Darvon compound Magnesium Salicylate Sine-off  Ansaid Dasin Capsules Magsal Sodium Salicylate  Anturane Depen Capsules Marnal Soma  APF Arthritis pain formula Dewitt's Pills Measurin Stanback  Argesic Dia-Gesic Meclofenamic Sulfinpyrazone  Arthritis Bayer Timed Release Aspirin Diclofenac Meclomen Sulindac  Arthritis pain formula Anacin Dicumarol Medipren Supac  Analgesic (Safety coated) Arthralgen Diffunasal Mefanamic Suprofen  Arthritis Strength Bufferin Dihydrocodeine Mepro Compound Suprol  Arthropan liquid Dopirydamole Methcarbomol with Aspirin Synalgos  ASA tablets/Enseals Disalcid Micrainin Tagament  Ascriptin Doan's Midol Talwin  Ascriptin A/D Dolene Mobidin Tanderil  Ascriptin Extra Strength Dolobid Moblgesic Ticlid  Ascriptin with Codeine Doloprin or Doloprin with Codeine Momentum Tolectin  Asperbuf Duoprin Mono-gesic Trendar  Aspergum Duradyne Motrin or Motrin IB Triminicin  Aspirin plain, buffered or enteric coated Durasal Myochrisine Trigesic  Aspirin Suppositories Easprin Nalfon Trillsate  Aspirin with Codeine Ecotrin Regular or Extra Strength Naprosyn Uracel  Atromid-S Efficin Naproxen Ursinus  Auranofin Capsules Elmiron Neocylate Vanquish  Axotal Emagrin Norgesic Verin  Azathioprine Empirin or Empirin with Codeine Normiflo Vitamin E  Azolid Emprazil Nuprin Voltaren  Bayer  Aspirin plain, buffered or children's or timed BC Tablets or powders Encaprin Orgaran Warfarin Sodium  Buff-a-Comp Enoxaparin Orudis Zorpin  Buff-a-Comp with Codeine Equegesic Os-Cal-Gesic   Buffaprin Excedrin plain, buffered or Extra Strength Oxalid   Bufferin Arthritis Strength Feldene Oxphenbutazone   Bufferin plain or Extra Strength Feldene Capsules Oxycodone with Aspirin   Bufferin with Codeine Fenoprofen Fenoprofen Pabalate or Pabalate-SF   Buffets II Flogesic Panagesic   Buffinol plain or Extra Strength Florinal or Florinal with Codeine Panwarfarin   Buf-Tabs Flurbiprofen Penicillamine   Butalbital Compound Four-way cold tablets   Penicillin   Butazolidin Fragmin Pepto-Bismol   Carbenicillin Geminisyn Percodan   Carna Arthritis Reliever Geopen Persantine   Carprofen Gold's salt Persistin   Chloramphenicol Goody's Phenylbutazone   Chloromycetin Haltrain Piroxlcam   Clmetidine heparin Plaquenil   Cllnoril Hyco-pap Ponstel   Clofibrate Hydroxy chloroquine Propoxyphen         Before stopping any of these medications, be sure to consult the physician who ordered them.  Some, such as Coumadin (Warfarin) are ordered to prevent or treat serious conditions such as "deep thrombosis", "pumonary embolisms", and other heart problems.  The amount of time that you may need off of the medication may also vary with the medication and the reason for which you were taking it.  If you are taking any of these medications, please make sure you notify your pain physician before you undergo any procedures.         Epidural Steroid Injection An epidural steroid injection is given to relieve pain in your neck, back, or legs that is caused by the irritation or swelling of a nerve root. This procedure involves injecting a steroid and numbing medicine (anesthetic) into the epidural space. The epidural space is the space between the outer covering of your spinal cord and the bones that form your backbone  (vertebra).  LET YOUR HEALTH CARE PROVIDER KNOW ABOUT:  2. Any allergies you have. 3. All medicines you are taking, including vitamins, herbs, eye drops, creams, and over-the-counter medicines such as aspirin. 4. Previous problems you or members of your family have had with the use of anesthetics. 5. Any blood disorders or blood clotting disorders you have. 6. Previous surgeries you have had. 7. Medical conditions you have. RISKS AND COMPLICATIONS Generally, this is a safe procedure. However, as with any procedure, complications can occur. Possible complications of epidural steroid injection include:  Headache.  Bleeding.  Infection.  Allergic reaction to the medicines.  Damage to your nerves. The response to this procedure depends on the underlying cause of the pain and its duration. People who have long-term (chronic) pain are less likely to benefit from epidural steroids than are those people whose pain comes on strong and suddenly. BEFORE THE PROCEDURE   Ask your health care provider about changing or stopping your regular medicines. You may be advised to stop taking blood-thinning medicines a few days before the procedure.  You may be given medicines to reduce anxiety.  Arrange for someone to take you home after the procedure. PROCEDURE   You will remain awake during the procedure. You may receive medicine to make you relaxed.  You will be asked to lie on your stomach.  The injection site will be cleaned.  The injection site will be numbed with a medicine (local anesthetic).  A needle will be injected through your skin into the epidural space.  Your health care provider will use an X-ray machine to ensure that the steroid is delivered closest to the affected nerve. You may have minimal discomfort at this time.  Once the needle is in the right position, the local anesthetic and the steroid will be injected into the epidural space.  The needle will then be removed and a  bandage will be applied to the injection site. AFTER THE PROCEDURE  12. You may be monitored for a short time before you go home. 13. You may feel weakness or numbness in your arm or leg, which disappears within hours. 14. You may be allowed to eat, drink, and take your regular   medicine. 15. You may have soreness at the site of the injection.   This information is not intended to replace advice given to you by your health care provider. Make sure you discuss any questions you have with your health care provider.   Document Released: 05/29/2007 Document Revised: 10/22/2012 Document Reviewed: 08/08/2012 [employer] Interactive Patient Education 2016 [employer]  

## 2015-03-15 NOTE — Progress Notes (Signed)
   Subjective:    Patient ID: Jenna Gutierrez, female    DOB: 10/16/1945, 70 y.o.   MRN: [id number]  HPI    Review of Systems     Objective:   Physical Exam        Assessment & Plan:    This patient was given a prescription of Vicoprofen 7.5/200 mg 1 tablet twice a day after meals and she was given 60 tablets     Lance Bosch M.D.

## 2015-03-15 NOTE — Procedures (Signed)
Date of procedure: 03/15/2015  Preoperative Diagnosis:  1 chronic low back pain 2 lumbar degenerative disc disease 3 lumbar radiculopathy  Postoperative Diagnosis: Same.  Procedure: 1. Caudal epidural steroid injection, 2. Epidural with interpretation. 3. Fluoroscopic guidance.  Surgeon: Lance Bosch, MD  Anesthesia: MAC anesthesia by the nursing staff under my direction  Informed consent was obtained and the patient appeared to accept and understand the benefits and risks of this procedure.   Pre procedure comments:  None                          Description of the Procedure:  The patient was taken to the operating room and placed in the prone position.  Intravenous sedation and MAC anesthesia was administered by the nursing staff under my direction. After appropriate sedation, the sacrococcygeal area was prepped with Betadine.  After adequate draping, the area between the sacral cornu was palpated and infiltrated with 3 cc of 1% Lidocaine.   An AP fluoroscopic view of the sacrum was visualized and a 17 gauge Tuohy needle was inserted in the midline at the angle of 45 degrees through the sacrococcygeal membrane.  After making contact with the bone, the needle was withdrawn and readvanced in horizontal position, into the caudal epidural space.  Epidurogram Study: One cc of Omnipaque 300 was injected through the needle and epidurogram was visualized in both the later and AP views. 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   The needle was removed and adequate  hemostasis was established.    This procedure was done using fluoroscopic control Fluoroscopic time was 0.1 minutes Number of frames was to MYG was 4.9  The patient tolerated the procedure quite well and vital signs were stable.   There were no adverse effects.  Additional comments:    The patient was taken to the recovery room in satisfactory condition where the patient was observed and subsequently discharged home.   Will follow up in the clinic in the next month.Lance Bosch MD

## 2015-03-15 NOTE — Progress Notes (Signed)
   Subjective:    Patient ID: Jenna Gutierrez, female    DOB: 1945/04/11, 70 y.o.   MRN: [id number]  HPI    Review of Systems     Objective:   Physical Exam        Assessment & Plan:

## 2015-03-15 NOTE — Telephone Encounter (Signed)
LMOM

## 2015-03-15 NOTE — Progress Notes (Signed)
Has been on antibiotic for UTI since 03-09-15. Also currently has a cold.

## 2015-03-15 NOTE — Telephone Encounter (Signed)
-----   Message from Nori Riis, PA-C sent at 03/11/2015  5:01 PM EST ----- Please tell the patient she has a positive urine culture. I sent a prescription for Augmentin 875/125 one tablet twice daily for 7 days to her pharmacy, [facility] on [address].  She has a follow-up appointment in 2 weeks. We will recheck a urinalysis at that appointment.

## 2015-03-16 ENCOUNTER — Telehealth: Payer: Self-pay | Admitting: *Deleted

## 2015-03-16 NOTE — Telephone Encounter (Signed)
Spoke with pt in reference to +ucx. Pt stated she has picked up abx and has been taking it. Made pt aware her urine would be checked again at f/u appt. Pt voiced understanding.

## 2015-03-16 NOTE — Telephone Encounter (Signed)
Follow up call post procedure. No problems addressed.

## 2015-03-16 NOTE — Telephone Encounter (Signed)
LMOM

## 2015-03-23 ENCOUNTER — Encounter: Payer: Self-pay | Admitting: Gastroenterology

## 2015-03-23 ENCOUNTER — Ambulatory Visit: Payer: Medicare PPO | Admitting: Urology

## 2015-03-29 ENCOUNTER — Other Ambulatory Visit: Payer: Self-pay

## 2015-04-08 ENCOUNTER — Encounter: Payer: Self-pay | Admitting: Urology

## 2015-04-08 ENCOUNTER — Ambulatory Visit: Payer: Medicare PPO | Admitting: Urology

## 2015-04-12 ENCOUNTER — Encounter: Payer: Self-pay | Admitting: Anesthesiology

## 2015-04-12 ENCOUNTER — Ambulatory Visit: Payer: Medicare PPO | Attending: Anesthesiology | Admitting: Anesthesiology

## 2015-04-12 ENCOUNTER — Other Ambulatory Visit: Payer: Self-pay | Admitting: Anesthesiology

## 2015-04-12 VITALS — BP 140/65 | HR 60 | Temp 98.4°F | Resp 14 | Ht 61.0 in | Wt 154.0 lb

## 2015-04-12 DIAGNOSIS — G8929 Other chronic pain: Secondary | ICD-10-CM | POA: Insufficient documentation

## 2015-04-12 DIAGNOSIS — M5116 Intervertebral disc disorders with radiculopathy, lumbar region: Secondary | ICD-10-CM | POA: Diagnosis not present

## 2015-04-12 DIAGNOSIS — Z9889 Other specified postprocedural states: Secondary | ICD-10-CM | POA: Insufficient documentation

## 2015-04-12 DIAGNOSIS — M545 Low back pain, unspecified: Secondary | ICD-10-CM

## 2015-04-12 DIAGNOSIS — M5137 Other intervertebral disc degeneration, lumbosacral region: Secondary | ICD-10-CM

## 2015-04-12 DIAGNOSIS — M5416 Radiculopathy, lumbar region: Secondary | ICD-10-CM

## 2015-04-12 MED ORDER — HYDROCODONE-IBUPROFEN 7.5-200 MG PO TABS: 1.0000 | ORAL_TABLET | Freq: Two times a day (BID) | ORAL | Status: DC

## 2015-04-12 MED ORDER — IOHEXOL 180 MG/ML  SOLN: 20.0000 mL | INTRAMUSCULAR | Status: AC | PRN

## 2015-04-12 MED ORDER — HYDROCODONE-IBUPROFEN 7.5-200 MG PO TABS: 1.0000 | ORAL_TABLET | Freq: Three times a day (TID) | ORAL | Status: DC | PRN

## 2015-04-12 MED ORDER — TRIAMCINOLONE ACETONIDE 40 MG/ML IJ SUSP: 80.0000 mg | Freq: Once | INTRAMUSCULAR | Status: AC

## 2015-04-12 MED ORDER — MIDAZOLAM HCL 5 MG/5ML IJ SOLN: 1.0000 mg | INTRAMUSCULAR | Status: AC

## 2015-04-12 MED ORDER — BUPIVACAINE HCL (PF) 0.25 % IJ SOLN: 20.0000 mL | Freq: Once | INTRAMUSCULAR | Status: AC

## 2015-04-12 MED ORDER — FENTANYL CITRATE (PF) 100 MCG/2ML IJ SOLN: 50.0000 ug | INTRAMUSCULAR | Status: AC

## 2015-04-12 NOTE — Patient Instructions (Signed)
Epidural Steroid Injection Patient Information  Description: The epidural space surrounds the nerves as they exit the spinal cord.  In some patients, the nerves can be compressed and inflamed by a bulging disc or a tight spinal canal (spinal stenosis).  By injecting steroids into the epidural space, we can bring irritated nerves into direct contact with a potentially helpful medication.  These steroids act directly on the irritated nerves and can reduce swelling and inflammation which often leads to decreased pain.  Epidural steroids may be injected anywhere along the spine and from the neck to the low back depending upon the location of your pain.   After numbing the skin with local anesthetic (like Novocaine), a small needle is passed into the epidural space slowly.  You may experience a sensation of pressure while this is being done.  The entire block usually last less than 10 minutes.  Conditions which may be treated by epidural steroids:   Low back and leg pain  Neck and arm pain  Spinal stenosis  Post-laminectomy syndrome  Herpes zoster (shingles) pain  Pain from compression fractures  Preparation for the injection:  1. Do not eat any solid food or dairy products within 6 hours of your appointment.  2. You may drink clear liquids up to 2 hours before appointment.  Clear liquids include water, black coffee, juice or soda.  No milk or cream please. 3. You may take your regular medication, including pain medications, with a sip of water before your appointment  Diabetics should hold regular insulin (if taken separately) and take 1/2 normal NPH dos the morning of the procedure.  Carry some sugar containing items with you to your appointment. 4. A driver must accompany you and be prepared to drive you home after your procedure.  5. Bring all your current medications with your. 6. An IV may be inserted and sedation may be given at the discretion of the physician.   7. A blood pressure  cuff, EKG and other monitors will often be applied during the procedure.  Some patients may need to have extra oxygen administered for a short period. 8. You will be asked to provide medical information, including your allergies, prior to the procedure.  We must know immediately if you are taking blood thinners (like Coumadin/Warfarin)  Or if you are allergic to IV iodine contrast (dye). We must know if you could possible be pregnant.  Possible side-effects:  Bleeding from needle site  Infection (rare, may require surgery)  Nerve injury (rare)  Numbness & tingling (temporary)  Difficulty urinating (rare, temporary)  Spinal headache ( a headache worse with upright posture)  Light -headedness (temporary)  Pain at injection site (several days)  Decreased blood pressure (temporary)  Weakness in arm/leg (temporary)  Pressure sensation in back/neck (temporary)  Call if you experience:  Fever/chills associated with headache or increased back/neck pain.  Headache worsened by an upright position.  New onset weakness or numbness of an extremity below the injection site  Hives or difficulty breathing (go to the emergency room)  Inflammation or drainage at the infection site  Severe back/neck pain  Any new symptoms which are concerning to you  Please note:  Although the local anesthetic injected can often make your back or neck feel good for several hours after the injection, the pain will likely return.  It takes 3-7 days for steroids to work in the epidural space.  You may not notice any pain relief for at least that one week.    If effective, we will often do a series of three injections spaced 3-6 weeks apart to maximally decrease your pain.  After the initial series, we generally will wait several months before considering a repeat injection of the same type.  If you have any questions, please call [phone] [place] were given a  prescription for Hydrocodone today.

## 2015-04-12 NOTE — Progress Notes (Signed)
   Subjective:    Patient ID: Marlana Salvage, female    DOB: [birth year], 70 y.o.   MRN: [id number]  HPI Mrs. Shean returned to the clinic today indicating that she is doing reasonably well She indicated that she did not get as much relief from this procedure as she did from the first one Today her subjective pain intensity rating is 60% She complains that she is experiencing some radicular pain going down her left leg Her straight leg raising test on the left leg is 50% Her straight leg raising test on the right side is 80% She indicates that she is coping reasonably well especially given the fact that her husband is getting progressively more ill with his medical problems Nevertheless she is in good spirits and she indicates that her pain is controlled with the fire call porphyrin 7.5/200 which she takes twice a day   Review of Systems  Constitutional: Negative.   HENT: Negative.   Eyes: Negative.   Respiratory: Negative.   Cardiovascular: Negative.   Gastrointestinal: Negative.   Endocrine: Negative.   Genitourinary: Negative.   Musculoskeletal:       She continues to have lower back pain which is now greatest on the left side and now she has left radicular pain Straight-leg raising test on the left side is 50 compared to 80 on the right side She's also had lumbar spine surgery for her chronic low back pain  Skin: Negative.   Allergic/Immunologic: Negative.   Neurological: Negative.   Hematological: Negative.   Psychiatric/Behavioral: Negative.        Objective:   Physical Exam  Cardiovascular:  Ms. Valladares appears to be in excellent spirits not withstanding her chronic pain syndrome Her blood pressure is 140/65 mmHg Pulse is 60 bpm Equal and regular Heart sounds 1 and 2 were heard in all areas There were no audible murmurs Respirations were 18 breaths per minute Temperature was 98.10F SPO2 was 97% Chest is clinically clear There were no adventitious  sounds Abdomen was soft and nontender There was no palpable organomegaly There was no significant lymphadenopathy Pupils were equal and reactive Cranial nerves were intact There were no new neurological or musculoskeletal findings  Nursing note and vitals reviewed.         Assessment & Plan:   Assessment 1 chronic low back pain 2 lumbar degenerative disc disease 3 lumbar radiculopathy 4 status post spinal surgery    Plan of management 1 for a left lumbar transforaminal epidural steroid injection at L4 L5 S1 2 Will continue Vicoprofen 7.5/200 one tablet twice a day and will give her 60 tablets 3 Will plan to perform her procedure this coming Friday which is February 10   Established patient     level III   [place].

## 2015-04-12 NOTE — Progress Notes (Signed)
Safety precautions to be maintained throughout the outpatient stay will include: orient to surroundings, keep bed in low position, maintain call bell within reach at all times, provide assistance with transfer out of bed and ambulation.  

## 2015-04-13 ENCOUNTER — Ambulatory Visit: Payer: Medicare PPO | Admitting: Anesthesiology

## 2015-04-17 LAB — TOXASSURE SELECT 13 (MW), URINE: PDF: 0

## 2015-05-09 ENCOUNTER — Ambulatory Visit: Payer: Medicare PPO | Attending: Anesthesiology | Admitting: Anesthesiology

## 2015-05-09 ENCOUNTER — Encounter: Payer: Self-pay | Admitting: Anesthesiology

## 2015-05-09 VITALS — BP 133/76 | HR 70 | Temp 98.3°F | Resp 16 | Ht 61.0 in | Wt 155.0 lb

## 2015-05-09 DIAGNOSIS — M545 Low back pain, unspecified: Secondary | ICD-10-CM

## 2015-05-09 DIAGNOSIS — G8929 Other chronic pain: Secondary | ICD-10-CM | POA: Diagnosis not present

## 2015-05-09 DIAGNOSIS — M51379 Other intervertebral disc degeneration, lumbosacral region without mention of lumbar back pain or lower extremity pain: Secondary | ICD-10-CM

## 2015-05-09 DIAGNOSIS — M5116 Intervertebral disc disorders with radiculopathy, lumbar region: Secondary | ICD-10-CM | POA: Insufficient documentation

## 2015-05-09 DIAGNOSIS — M4806 Spinal stenosis, lumbar region: Secondary | ICD-10-CM | POA: Insufficient documentation

## 2015-05-09 DIAGNOSIS — M5137 Other intervertebral disc degeneration, lumbosacral region: Secondary | ICD-10-CM

## 2015-05-09 DIAGNOSIS — Z9889 Other specified postprocedural states: Secondary | ICD-10-CM | POA: Diagnosis not present

## 2015-05-09 DIAGNOSIS — M5416 Radiculopathy, lumbar region: Secondary | ICD-10-CM

## 2015-05-09 MED ORDER — HYDROCODONE-IBUPROFEN 7.5-200 MG PO TABS: 1.0000 | ORAL_TABLET | Freq: Two times a day (BID) | ORAL | Status: DC

## 2015-05-09 NOTE — Progress Notes (Signed)
   Subjective:    Patient ID: Jenna Gutierrez, female    DOB: January 08, 1946, 70 y.o.   MRN: [id number]  HPI  This patient returned to the clinic today indicating that she got several days pain relief from the last procedure She indicated to me that her insurance company was not willing to pay for the last procedure since it was an epidural steroid injection I tried to explain to her as I will try to explain to the insurance company that there is a difference between a caudal epidural steroid injection and a lumbar transforaminal epidural steroid injection in that the caudal is more nonspecific while the lumbar transforaminal epidural steroid injection is more specific and targeted of specific lumbar nerve roots She appeared to understand that but I am not too sure if the insurance companies would understand this simple explanation Today her subjective pain intensity rating is 80% because she said her medicine finished yesterday However when she has her medications her subjective pain intensity rating is about 30%    Review of Systems  Constitutional: Negative.   HENT: Negative.   Eyes: Negative.   Respiratory: Negative.   Cardiovascular: Negative.   Gastrointestinal: Negative.   Endocrine: Negative.   Genitourinary: Negative.   Musculoskeletal: Positive for myalgias, back pain, arthralgias and gait problem. Negative for joint swelling, neck pain and neck stiffness.  Skin: Negative.   Allergic/Immunologic: Negative.   Neurological: Negative.   Hematological: Negative.   Psychiatric/Behavioral: Negative.        Objective:   Physical Exam  Cardiovascular:  This was a pleasant delightful lady who appeared to be in no distress She indicates that her subjective pain intensity rating was 80% which was higher than when she normally takes her medication Her blood pressure is 133/76 mmHg Her pulse is 70 bpm Equal and regular Heart Sounds 1 and 2 were heard in all areas There were no  audible murmurs Temperature was 98.50F  Pulse was 16 breaths per minute SPO2 was 98% Chest was clinically clear There were no adventitious sounds Abdomen was soft and nontender There was no palpable organomegaly There was no significant lymphadenopathy Pupils were equal and reactive Cranial nerves were intact There was tenderness in the lower lumbar area radiating to the left lower extremity  There were no new neurological or musculoskeletal findings  Nursing note and vitals reviewed.         Assessment & Plan:   Assessment 1 chronic low back pain 2 lumbar degenerative disc disease 3 lumbar radiculopathy 4 Lumbar spinal stenosis 5 status post lumbar spine surgery    Plan of management 1 Will withhold all injections at the present time 2 we will continue her on the Vicoprofen 5/200 one tablet twice a day and will give her 60 tablets 3 when I return from sabbatical I will consider a trial of spinal cord stimulator for her 4 follow up with her in 1 month   Established patient       level III    Lance Bosch M.D.

## 2015-05-09 NOTE — Progress Notes (Signed)
Safety precautions to be maintained throughout the outpatient stay will include: orient to surroundings, keep bed in low position, maintain call bell within reach at all times, provide assistance with transfer out of bed and ambulation.  

## 2015-05-09 NOTE — Patient Instructions (Signed)
You were given a prescription for Vicoprofen today.

## 2015-06-08 ENCOUNTER — Encounter: Payer: Self-pay | Admitting: Anesthesiology

## 2015-06-08 ENCOUNTER — Ambulatory Visit: Payer: Medicare PPO | Attending: Anesthesiology | Admitting: Anesthesiology

## 2015-06-08 VITALS — BP 125/78 | HR 66 | Temp 98.4°F | Resp 16 | Ht 61.0 in | Wt 154.0 lb

## 2015-06-08 DIAGNOSIS — M545 Low back pain, unspecified: Secondary | ICD-10-CM

## 2015-06-08 DIAGNOSIS — M5116 Intervertebral disc disorders with radiculopathy, lumbar region: Secondary | ICD-10-CM | POA: Insufficient documentation

## 2015-06-08 DIAGNOSIS — G8929 Other chronic pain: Secondary | ICD-10-CM | POA: Insufficient documentation

## 2015-06-08 DIAGNOSIS — M5137 Other intervertebral disc degeneration, lumbosacral region: Secondary | ICD-10-CM

## 2015-06-08 DIAGNOSIS — M5417 Radiculopathy, lumbosacral region: Secondary | ICD-10-CM

## 2015-06-08 DIAGNOSIS — R52 Pain, unspecified: Secondary | ICD-10-CM | POA: Diagnosis present

## 2015-06-08 MED ORDER — HYDROCODONE-IBUPROFEN 7.5-200 MG PO TABS: 1.0000 | ORAL_TABLET | Freq: Every morning | ORAL | Status: DC

## 2015-06-08 NOTE — Progress Notes (Signed)
   Subjective:    Patient ID: Jenna Gutierrez, female    DOB: [birth year], 70 y.o.   MRN: [id number]  HPI  This patient returned to the clinic today indicating that she was doing reasonably well Her pain was adequately controlled following the procedures and of supplementation from the [place] Today her subjective pain intensity rating is 45% She is able to perform her activities of daily living and is doing quite well  Review of Systems  Constitutional: Negative.   Eyes: Negative.   Respiratory: Negative.   Cardiovascular: Negative.   Gastrointestinal: Negative.   Endocrine: Negative.   Genitourinary: Negative.   Musculoskeletal: Positive for myalgias, back pain, arthralgias and gait problem. Negative for joint swelling, neck pain and neck stiffness.  Allergic/Immunologic: Negative.   Neurological: Negative.   Hematological: Negative.   Psychiatric/Behavioral: Negative.        Objective:   Physical Exam  Cardiovascular:  This patient is in no distress Blood pressure is 125/78 mmHg Her pulse is 66 beats per minute Equal and regular Heart sounds 1 and 2 were heard in all areas There were no audible murmurs Temperature was 98.7F Respirations were 16 breaths per minute SPO2 was 99% Chest is clinically clear There were no adventitious sounds Abdomen was soft and nontender There was no palpable organomegaly There is no significant lymphadenopathy Pupils were equal and reactive Cranial nerves were intact There were no new neurological normal musculoskeletal findings  Nursing note and vitals reviewed.         Assessment & Plan:   Assessment 1 chronic low back pain 2 lumbar degenerative disc disease 3 lumbar radiculopathy   Plan of management Since patient is doing that well but will fall all interventions at this time We'll continue her on Vicoprofen 7.5/200 mg 1 tab to 2 tabs per day We will follow-up with her in 2 months    Established patient      level III   Lance Bosch M.D.

## 2015-06-08 NOTE — Progress Notes (Signed)
Safety precautions to be maintained throughout the outpatient stay will include: orient to surroundings, keep bed in low position, maintain call bell within reach at all times, provide assistance with transfer out of bed and ambulation.  

## 2015-06-08 NOTE — Patient Instructions (Signed)
You were given a prescription for Vicoprofen today.

## 2015-06-09 ENCOUNTER — Ambulatory Visit: Payer: Medicare PPO | Admitting: Anesthesiology

## 2015-07-08 ENCOUNTER — Ambulatory Visit: Payer: Medicare PPO | Attending: Anesthesiology | Admitting: Anesthesiology

## 2015-07-08 ENCOUNTER — Encounter: Payer: Self-pay | Admitting: Anesthesiology

## 2015-07-08 ENCOUNTER — Telehealth: Payer: Self-pay | Admitting: *Deleted

## 2015-07-08 VITALS — BP 118/65 | HR 65 | Temp 98.5°F | Resp 16 | Ht 61.0 in | Wt 155.0 lb

## 2015-07-08 DIAGNOSIS — M5137 Other intervertebral disc degeneration, lumbosacral region: Secondary | ICD-10-CM

## 2015-07-08 DIAGNOSIS — M25551 Pain in right hip: Secondary | ICD-10-CM | POA: Insufficient documentation

## 2015-07-08 DIAGNOSIS — M545 Low back pain, unspecified: Secondary | ICD-10-CM

## 2015-07-08 DIAGNOSIS — M5116 Intervertebral disc disorders with radiculopathy, lumbar region: Secondary | ICD-10-CM | POA: Diagnosis not present

## 2015-07-08 DIAGNOSIS — G8929 Other chronic pain: Secondary | ICD-10-CM | POA: Diagnosis not present

## 2015-07-08 DIAGNOSIS — M5417 Radiculopathy, lumbosacral region: Secondary | ICD-10-CM

## 2015-07-08 MED ORDER — HYDROCODONE-IBUPROFEN 7.5-200 MG PO TABS: 1.0000 | ORAL_TABLET | ORAL | Status: DC

## 2015-07-08 MED ORDER — DICLOFENAC EPOLAMINE 1.3 % TD PTCH: 1.0000 | MEDICATED_PATCH | TRANSDERMAL | Status: AC

## 2015-07-08 NOTE — Telephone Encounter (Signed)
Pt is aware that we have to get authorization for the insurance before bilateral trans epidural steroid injection. i left order in Angie box...td

## 2015-07-08 NOTE — Progress Notes (Signed)
Safety precautions to be maintained throughout the outpatient stay will include: orient to surroundings, keep bed in low position, maintain call bell within reach at all times, provide assistance with transfer out of bed and ambulation.  

## 2015-07-08 NOTE — Patient Instructions (Signed)
You were given prescriptions for Vicoprofen and Flector patch today. Epidural Steroid Injection Patient Information  Description: The epidural space surrounds the nerves as they exit the spinal cord.  In some patients, the nerves can be compressed and inflamed by a bulging disc or a tight spinal canal (spinal stenosis).  By injecting steroids into the epidural space, we can bring irritated nerves into direct contact with a potentially helpful medication.  These steroids act directly on the irritated nerves and can reduce swelling and inflammation which often leads to decreased pain.  Epidural steroids may be injected anywhere along the spine and from the neck to the low back depending upon the location of your pain.   After numbing the skin with local anesthetic (like Novocaine), a small needle is passed into the epidural space slowly.  You may experience a sensation of pressure while this is being done.  The entire block usually last less than 10 minutes.  Conditions which may be treated by epidural steroids:   Low back and leg pain  Neck and arm pain  Spinal stenosis  Post-laminectomy syndrome  Herpes zoster (shingles) pain  Pain from compression fractures  Preparation for the injection:  1. Do not eat any solid food or dairy products within 8 hours of your appointment.  2. You may drink clear liquids up to 3 hours before appointment.  Clear liquids include water, black coffee, juice or soda.  No milk or cream please. 3. You may take your regular medication, including pain medications, with a sip of water before your appointment  Diabetics should hold regular insulin (if taken separately) and take 1/2 normal NPH dos the morning of the procedure.  Carry some sugar containing items with you to your appointment. 4. A driver must accompany you and be prepared to drive you home after your procedure.  5. Bring all your current medications with your. 6. An IV may be inserted and sedation may  be given at the discretion of the physician.   7. A blood pressure cuff, EKG and other monitors will often be applied during the procedure.  Some patients may need to have extra oxygen administered for a short period. 8. You will be asked to provide medical information, including your allergies, prior to the procedure.  We must know immediately if you are taking blood thinners (like Coumadin/Warfarin)  Or if you are allergic to IV iodine contrast (dye). We must know if you could possible be pregnant.  Possible side-effects:  Bleeding from needle site  Infection (rare, may require surgery)  Nerve injury (rare)  Numbness & tingling (temporary)  Difficulty urinating (rare, temporary)  Spinal headache ( a headache worse with upright posture)  Light -headedness (temporary)  Pain at injection site (several days)  Decreased blood pressure (temporary)  Weakness in arm/leg (temporary)  Pressure sensation in back/neck (temporary)  Call if you experience:  Fever/chills associated with headache or increased back/neck pain.  Headache worsened by an upright position.  New onset weakness or numbness of an extremity below the injection site  Hives or difficulty breathing (go to the emergency room)  Inflammation or drainage at the infection site  Severe back/neck pain  Any new symptoms which are concerning to you  Please note:  Although the local anesthetic injected can often make your back or neck feel good for several hours after the injection, the pain will likely return.  It takes 3-7 days for steroids to work in the epidural space.  You may not notice  any pain relief for at least that one week.  If effective, we will often do a series of three injections spaced 3-6 weeks apart to maximally decrease your pain.  After the initial series, we generally will wait several months before considering a repeat injection of the same type.  If you have any questions, please call [phone] [facility]

## 2015-07-10 NOTE — Progress Notes (Signed)
   Subjective:    Patient ID: Jenna Gutierrez, female    DOB: Aug 01, 1945, 70 y.o.   MRN: [id number]  HPI  This patient is a pleasant delightfu has chronic right hip pain combined with chronic low back pain Her subjective pain intensity rating is 60% She indicated that she got some modest relief from the procedures but she has unacceptable residual pain She indicates that she is coping the  On the circumstances  Review of Systems  Constitutional: Negative.   HENT: Negative.   Eyes: Negative.   Respiratory: Negative.   Cardiovascular: Negative.   Endocrine: Negative.   Genitourinary: Negative.   Musculoskeletal: Positive for myalgias, back pain, arthralgias and gait problem.  Skin: Negative.   Allergic/Immunologic: Negative.   Neurological: Negative.   Hematological: Negative.   Psychiatric/Behavioral: Negative.        Objective:   Physical Exam  Cardiovascular:  This patient appears to be a bit troubled about her pain but is in no particular distress Her blood pressure is 118/65 mmHg Her pulse is 65 bpm Equal and regular Heart t sounds 1 and 2 were heard in all areas There were no audible murmurs Temperature was 98.70F Respirations was 16 breaths per minute  SPO2 was 100% Chest is clinically clear There are no adventitious sounds Abdomen is soft and nontender Is no palpable organomegaly There is no significant lymphadeno Pupils are equal and reactive There are no new neurological nor muscular-skeletal findings  Nursing note and vitals reviewed.         Assessment & Plan:   Assessment 1 chronic low back pain 2  lumbar degenerative disc disease 3 lumbar radiculopathy For chronic right hip pain   Plan of management 1 we will continue the patient on Vicoprofen to be taken in the morning and will give her 14 tabs 2 we will begin her on a Flector patch and will give her 15 patches 3 we'll follow-up with her in 2 weeks   Established patient      level  [employer].D.

## 2015-07-11 ENCOUNTER — Telehealth: Payer: Self-pay | Admitting: Anesthesiology

## 2015-07-11 NOTE — Telephone Encounter (Signed)
Patient needs to speak with nurse regarding scripts received on Friday, please call ASAP

## 2015-07-11 NOTE — Telephone Encounter (Signed)
Spoke with patient, will discuss this with Dr. Idelia Salm on Friday.

## 2015-07-11 NOTE — Telephone Encounter (Signed)
Patient states she assumed that prescription was for 1 tablet twice a day and has been taking them as such.  Actually prescription was for 1 tablet daily.  Patient has ran out and pharmacy will not fill for 28 days.

## 2015-07-21 ENCOUNTER — Telehealth: Payer: Self-pay | Admitting: *Deleted

## 2015-07-21 NOTE — Telephone Encounter (Signed)
sw pt gave appt reminder per her request for 07/22/15@ 11am.the patient is aware...td

## 2015-07-22 ENCOUNTER — Ambulatory Visit: Payer: Medicare PPO | Attending: Anesthesiology | Admitting: Anesthesiology

## 2015-07-22 ENCOUNTER — Encounter: Payer: Self-pay | Admitting: Anesthesiology

## 2015-07-22 VITALS — BP 114/52 | HR 74 | Temp 97.5°F | Resp 16 | Ht 61.0 in | Wt 155.0 lb

## 2015-07-22 DIAGNOSIS — M545 Low back pain, unspecified: Secondary | ICD-10-CM

## 2015-07-22 DIAGNOSIS — M5416 Radiculopathy, lumbar region: Secondary | ICD-10-CM

## 2015-07-22 DIAGNOSIS — G8929 Other chronic pain: Secondary | ICD-10-CM | POA: Diagnosis not present

## 2015-07-22 DIAGNOSIS — M5116 Intervertebral disc disorders with radiculopathy, lumbar region: Secondary | ICD-10-CM | POA: Diagnosis not present

## 2015-07-22 DIAGNOSIS — M5137 Other intervertebral disc degeneration, lumbosacral region: Secondary | ICD-10-CM

## 2015-07-22 MED ORDER — HYDROCODONE-IBUPROFEN 7.5-200 MG PO TABS: 1.0000 | ORAL_TABLET | Freq: Two times a day (BID) | ORAL | Status: AC

## 2015-07-22 NOTE — Patient Instructions (Signed)
You were given a prescription for Vicoprofen today.

## 2015-07-22 NOTE — Progress Notes (Signed)
Safety precautions to be maintained throughout the outpatient stay will include: orient to surroundings, keep bed in low position, maintain call bell within reach at all times, provide assistance with transfer out of bed and ambulation.  

## 2015-07-26 NOTE — Progress Notes (Signed)
   Subjective:    Patient ID: Jenna Gutierrez, female    DOB: May 07, 1945, 70 y.o.   MRN: [id number]  HPI  This patient returned to the clinic indicating that Flector patch was not approved by the insurance First we may have to give her more Vicoprofen to manage her pain today  Her subjective pain is 70% Apart from that she is coping quite well  Review of Systems  Constitutional: Negative for fever, chills, diaphoresis, activity change, appetite change, fatigue and unexpected weight change.  HENT: Negative for congestion, dental problem, drooling, ear discharge, ear pain, facial swelling, hearing loss, mouth sores, nosebleeds, postnasal drip, rhinorrhea, sinus pressure, sneezing, sore throat, tinnitus, trouble swallowing and voice change.   Eyes: Negative.  Negative for photophobia, pain, discharge, redness, itching and visual disturbance.  Respiratory: Negative for apnea, cough, choking, chest tightness, shortness of breath, wheezing and stridor.   Cardiovascular: Negative for chest pain, palpitations and leg swelling.       This patient has a history of bradycardia  Gastrointestinal: Negative for nausea, vomiting, abdominal pain, diarrhea, constipation, blood in stool, abdominal distention, anal bleeding and rectal pain.  Endocrine: Negative for cold intolerance, heat intolerance, polydipsia, polyphagia and polyuria.  Genitourinary: Negative for dysuria, urgency, frequency, hematuria, flank pain, decreased urine volume, enuresis, difficulty urinating, genital sores, menstrual problem, pelvic pain and dyspareunia.  Musculoskeletal: Positive for myalgias, back pain, joint swelling, arthralgias and gait problem. Negative for neck pain and neck stiffness.  Skin: Negative.  Negative for color change, pallor, rash and wound.  Allergic/Immunologic: Negative for environmental allergies, food allergies and immunocompromised state.  Neurological: Negative for dizziness, tremors, seizures, syncope,  facial asymmetry, speech difficulty, weakness, light-headedness, numbness and headaches.  Hematological: Negative for adenopathy. Does not bruise/bleed easily.       This patient has a history of iron deficiency anemia  Psychiatric/Behavioral: Negative.  Negative for suicidal ideas, hallucinations, behavioral problems, confusion, sleep disturbance, self-injury, dysphoric mood, decreased concentration and agitation. The patient is not nervous/anxious and is not hyperactive.        Objective:   Physical Exam  Cardiovascular:  She appears to be in no distress Her blood pressure is 114/52 millimeters of mercury Pulse is 74 bpm Temperature is 97.70F SPO2 is 96% Respirations are 16 breaths/min Chest is clinically clear There are no adventitious sounds Heart sounds 1 and 2 were heard in all areas There were no audible murmurs Pupils are equal and reactive Cranial nerves are intact  Nursing note and vitals reviewed.         Assessment & Plan:   Assessment 1 chronic low back pain 2 lumbar degenerative  Disc disease 3 lumbar radiculopathy     Plan of management 1 discontinue Flector patch 2 begin Vicoprofen 7.5/200 mg twice a day #30 tablets 3 to come back in 2 weeks    Established patient       Level III   Lance Bosch M.D.

## 2015-08-03 ENCOUNTER — Emergency Department: Payer: Medicare PPO

## 2015-08-03 ENCOUNTER — Emergency Department
Admission: EM | Admit: 2015-08-03 | Discharge: 2015-08-03 | Disposition: A | Discharge status: Home / Self Care | Payer: Medicare PPO | Attending: Emergency Medicine | Admitting: Emergency Medicine

## 2015-08-03 ENCOUNTER — Encounter: Payer: Self-pay | Admitting: Emergency Medicine

## 2015-08-03 DIAGNOSIS — J449 Chronic obstructive pulmonary disease, unspecified: Secondary | ICD-10-CM | POA: Insufficient documentation

## 2015-08-03 DIAGNOSIS — M79604 Pain in right leg: Secondary | ICD-10-CM | POA: Diagnosis present

## 2015-08-03 DIAGNOSIS — M1990 Unspecified osteoarthritis, unspecified site: Secondary | ICD-10-CM | POA: Diagnosis not present

## 2015-08-03 DIAGNOSIS — M62838 Other muscle spasm: Secondary | ICD-10-CM

## 2015-08-03 DIAGNOSIS — Z87891 Personal history of nicotine dependence: Secondary | ICD-10-CM | POA: Insufficient documentation

## 2015-08-03 DIAGNOSIS — I10 Essential (primary) hypertension: Secondary | ICD-10-CM | POA: Insufficient documentation

## 2015-08-03 DIAGNOSIS — J45909 Unspecified asthma, uncomplicated: Secondary | ICD-10-CM | POA: Diagnosis not present

## 2015-08-03 DIAGNOSIS — E039 Hypothyroidism, unspecified: Secondary | ICD-10-CM | POA: Insufficient documentation

## 2015-08-03 DIAGNOSIS — R252 Cramp and spasm: Secondary | ICD-10-CM | POA: Diagnosis not present

## 2015-08-03 DIAGNOSIS — Z8679 Personal history of other diseases of the circulatory system: Secondary | ICD-10-CM | POA: Insufficient documentation

## 2015-08-03 DIAGNOSIS — Z7951 Long term (current) use of inhaled steroids: Secondary | ICD-10-CM | POA: Diagnosis not present

## 2015-08-03 DIAGNOSIS — Z79899 Other long term (current) drug therapy: Secondary | ICD-10-CM | POA: Insufficient documentation

## 2015-08-03 LAB — BASIC METABOLIC PANEL
ANION GAP: 8 (ref 5–15)
BUN: 15 mg/dL (ref 6–20)
CALCIUM: 9.5 mg/dL (ref 8.9–10.3)
CO2: 26 mmol/L (ref 22–32)
CREATININE: 0.67 mg/dL (ref 0.44–1.00)
Chloride: 103 mmol/L (ref 101–111)
GFR calc Af Amer: >60 mL/min (ref >60)
GFR calc non Af Amer: >60 mL/min (ref >60)
GLUCOSE: 117 mg/dL — AB (ref 65–99)
POTASSIUM: 3.6 mmol/L (ref 3.5–5.1)
Sodium: 137 mmol/L (ref 135–145)

## 2015-08-03 LAB — CK: Total CK: 75 U/L (ref 38–234)

## 2015-08-03 LAB — MAGNESIUM: MAGNESIUM: 1.8 mg/dL (ref 1.7–2.4)

## 2015-08-03 MED ORDER — DIAZEPAM 5 MG PO TABS
ORAL_TABLET | ORAL | Status: AC
  Administered 2015-08-03: 5 mg via ORAL
  Filled 2015-08-03: qty 1

## 2015-08-03 MED ORDER — DIAZEPAM 5 MG PO TABS
5.0000 mg | ORAL_TABLET | Freq: Once | ORAL | Status: AC
  Administered 2015-08-03: 5 mg via ORAL

## 2015-08-03 MED ORDER — ACETAMINOPHEN 500 MG PO TABS
1000.0000 mg | ORAL_TABLET | Freq: Once | ORAL | Status: AC
  Administered 2015-08-03: 1000 mg via ORAL

## 2015-08-03 MED ORDER — ACETAMINOPHEN 500 MG PO TABS
ORAL_TABLET | ORAL | Status: AC
  Filled 2015-08-03: qty 2

## 2015-08-03 MED ORDER — DIAZEPAM 5 MG PO TABS: 5.0000 mg | ORAL_TABLET | Freq: Three times a day (TID) | ORAL | Status: DC | PRN

## 2015-08-03 NOTE — ED Notes (Signed)
Called the lab to check on why the results hadn't come back after an hour and 15 minutes. Told by Manuela Schwartz that they were having equipment issues and that they were re-running them now.

## 2015-08-03 NOTE — ED Notes (Signed)
Pt to ED with R lower leg swelling, with reported soreness on back of calf and shin x2 days.  Pt states that today her leg started "jerking uncontrollable".  In triage, swelling and tightness in R LE is noted to be more than left leg, and pain in R calf noted to be worse with dorsiflexion.

## 2015-08-03 NOTE — ED Notes (Signed)
Discharge instructions reviewed with patient. Patient verbalized understanding. Patient ambulated to lobby without difficulty to wait for her ride home.

## 2015-08-03 NOTE — ED Provider Notes (Signed)
[facility] Emergency Department Provider Note  ____________________________________________  Time seen: Approximately 8:29 PM  I have reviewed the triage vital signs and the nursing notes.   HISTORY  Chief Complaint Leg Swelling    HPI Jenna Gutierrez is a 70 y.o. female with a history of HTN not on a diuretic, COPD, narcolepsy, presenting with right lower extremity jerking and pain. The patient reports that this morning she was sitting when she began to have pain on the posterior aspect of the lower leg associated with it "drawing up." Since then has continued to do so at higher frequency with greater pain, now she feels like her left leg might be starting to do the same. She's never had this before and does not have any neurologic diseases including Parkinson's disease. She has had no trauma, fever, or recent changes in her medications. She has tried Advil and Tylenol without any improvement.   Past Medical History  Diagnosis Date  . Bradycardia   . Unspecified hypothyroidism   . 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 Diverticulosis   . MVP (mitral valve prolapse)   . COPD (chronic obstructive pulmonary disease) ([place])   . Asthma   . Narcolepsy   . Arthritis   . Hypertension   . Asthma   . Emphysema of lung ([place])   . Anemia   . GERD (gastroesophageal reflux disease)   . IBS (irritable bowel syndrome)   . BRADYCARDIA 08/25/2008    Qualifier: Diagnosis of  By: Madilyn Fireman MD, [place]      Patient Active Problem List   Diagnosis Date Noted  . Lumbar radiculopathy 04/12/2015  . UTI (lower urinary tract infection) 03/09/2015  . Acute cystitis without hematuria 03/09/2015  . Drug-seeking behavior 03/09/2015  . Vaginal atrophy 03/09/2015  . Back pain at L4-L5 level 01/25/2015  . DDD (degenerative disc disease), lumbosacral 01/25/2015  . Lumbosacral radiculopathy 01/25/2015  . EOSINOPHILIC ESOPHAGITIS 123456  . ANEMIA, IRON DEFICIENCY 11/22/2009  . OTHER  DYSPHAGIA 11/22/2009  . HEPATOMEGALY 11/22/2009  . NONSPECIFIC ABN FINDING RAD & OTH EXAM GI TRACT 11/30/2008  . UNSPECIFIED HYPOTHYROIDISM 08/25/2008  . BRADYCARDIA 08/25/2008  . FATIGUE 08/25/2008  . ABDOMINAL PAIN 08/25/2008  . GERD 05/17/2008  . CONSTIPATION 05/17/2008  . EPIGASTRIC PAIN 05/17/2008    Past Surgical History  Procedure Laterality Date  . Appendectomy  1973  . Hysterectomy (other)  1976  . Cholecystectomy  1980  . Back and neck  1994  . Bladder tack  2001  . Brain surgery  2008  . Cataracts  2009  . Carpel tunnel  2001  . Abdominal hernia repair  2008    Dr. Rise Patience  . Benign liver tumor removal    . Bunion surgerty    . Appendectomy    . Abdominal hysterectomy    . Cholecystectomy    . Eye surgery    . Back surgery    . Bladder surgery      x4    Current Outpatient Rx  Name  Route  Sig  Dispense  Refill  . acetaminophen-codeine (TYLENOL #3) 300-30 MG per tablet   Oral   Take 1 tablet by mouth every 6 (six) hours as needed for moderate pain. Patient not taking: Reported on 06/08/2015   20 tablet   0   . albuterol (PROAIR HFA) 108 (90 BASE) MCG/ACT inhaler   Inhalation   Inhale 2 puffs into the lungs every 6 (six) hours as needed.           Marland Kitchen  albuterol (PROVENTIL HFA;VENTOLIN HFA) 108 (90 BASE) MCG/ACT inhaler   Inhalation   Inhale into the lungs every 6 (six) hours as needed for wheezing or shortness of breath. Reported on 03/09/2015         . amoxicillin-clavulanate (AUGMENTIN) 875-125 MG tablet   Oral   Take 1 tablet by mouth every 12 (twelve) hours. Patient not taking: Reported on 04/12/2015   14 tablet   0   . Armodafinil (NUVIGIL) 250 MG tablet   Oral   Take 250 mg by mouth daily. Reported on 07/08/2015         . cholecalciferol (VITAMIN D) 1000 UNITS tablet   Oral   Take 1,000 Units by mouth daily. Reported on 03/09/2015         . dexlansoprazole (DEXILANT) 60 MG capsule   Oral   Take 60 mg by mouth daily. Reported on  03/09/2015         . dexlansoprazole (KAPIDEX) 60 MG capsule   Oral   Take 60 mg by mouth daily. 20 min before breakfast          . diazepam (VALIUM) 5 MG tablet   Oral   Take 1 tablet (5 mg total) by mouth every 8 (eight) hours as needed for muscle spasms.   12 tablet   0   . diclofenac (FLECTOR) 1.3 % PTCH   Transdermal   Place 1 patch onto the skin daily after supper. Patient not taking: Reported on 07/22/2015   14 patch   1   . DULoxetine (CYMBALTA) 30 MG capsule   Oral   Take 30 mg by mouth daily. Reported on 03/09/2015         . fluticasone (FLONASE) 50 MCG/ACT nasal spray   Nasal   Place 1 spray into the nose 2 (two) times daily. Reported on 03/09/2015         . fluticasone (FLONASE) 50 MCG/ACT nasal spray   Each Nare   Place into both nostrils daily. Reported on 07/08/2015         . fluticasone (FLOVENT HFA) 220 MCG/ACT inhaler   Inhalation   Inhale 2 puffs into the lungs 2 (two) times daily. Reported on 05/09/2015         . HYDROcodone-ibuprofen (VICOPROFEN) 7.5-200 MG tablet   Oral   Take 1 tablet by mouth 1 day or 1 dose.   14 tablet   0   . HYDROcodone-ibuprofen (VICOPROFEN) 7.5-200 MG tablet   Oral   Take 1 tablet by mouth 2 (two) times daily after a meal.   30 tablet   0   . levothyroxine (LEVO-T) 75 MCG tablet   Oral   Take 75 mcg by mouth daily before breakfast. Reported on 07/22/2015         . levothyroxine (LEVOTHROID) 50 MCG tablet   Oral   Take 50 mcg by mouth daily. Reported on 07/08/2015         . lubiprostone (AMITIZA) 24 MCG capsule   Oral   Take 24 mcg by mouth 2 (two) times daily with a meal.         . methylPREDNISolone (MEDROL DOSEPAK) 4 MG TBPK tablet      Take as directed Patient not taking: Reported on 03/09/2015   21 tablet   0   . modafinil (PROVIGIL) 200 MG tablet   Oral   Take 200 mg by mouth daily. Reported on 07/22/2015         . NON FORMULARY  Reported on 03/09/2015         . Sodium Oxybate (XYREM)  500 MG/ML SOLN   Oral   Take by mouth as directed. Reported on 06/08/2015         . sucralfate (CARAFATE) 1 G tablet   Oral   Take 1 g by mouth. Reported on 03/15/2015           Allergies Sulfa antibiotics  Family History  Problem Relation Age of Onset  . Heart failure Mother   . Esophageal cancer Sister   . Ovarian cancer Sister     Social History Social History  Substance Use Topics  . Smoking status: Former Smoker    Quit date: 03/09/2007  . Smokeless tobacco: Never Used  . Alcohol Use: No    Review of Systems Constitutional: No fever/chills.No lightheadedness or syncope. Eyes: No visual changes. ENT: No sore throat. No congestion or rhinorrhea. Cardiovascular: Denies chest pain. Denies palpitations. Respiratory: Denies shortness of breath.  No cough. Gastrointestinal: No abdominal pain.  No nausea, no vomiting.  No diarrhea.  No constipation. Genitourinary: Negative for dysuria. Musculoskeletal: Negative for back pain. Positive jerking of the right lower extremity. Positive right posterior leg pain.  Normal DP and PT pulses bilaterally. Normal sensation to light touch in the bilateral lower extremities. Skin: Negative for rash. Neurological: Negative for headaches. No focal numbness, tingling or weakness.   10-point ROS otherwise negative.  ____________________________________________   PHYSICAL EXAM:  VITAL SIGNS: ED Triage Vitals  Enc Vitals Group     BP 08/03/15 1803 135/115 mmHg     Pulse Rate 08/03/15 1803 76     Resp 08/03/15 1803 20     Temp 08/03/15 1803 98.4 F (36.9 C)     Temp Source 08/03/15 1803 Oral     SpO2 08/03/15 1803 99 %     Weight 08/03/15 1803 154 lb (69.854 kg)     Height 08/03/15 1803 5\' 1"  (1.549 m)     Head Cir --      Peak Flow --      Pain Score 08/03/15 1803 10     Pain Loc --      Pain Edu? --      Excl. in [place]? --     Constitutional: Alert and oriented. Well appearing and in no acute distress. Answers questions  appropriately. Eyes: Conjunctivae are normal.  EOMI. No scleral icterus. Head: Atraumatic. Nose: No congestion/rhinnorhea. Mouth/Throat: Mucous membranes are moist. No fasiculations of the tongue Neck: No stridor.  Supple.   Cardiovascular: Normal rate, regular rhythm. No murmurs, rubs or gallops.  Respiratory: Normal respiratory effort.  No accessory muscle use or retractions. Lungs CTAB.  No wheezes, rales or ronchi. Gastrointestinal: Soft, nontender and nondistended.  No guarding or rebound.  No peritoneal signs. Musculoskeletal: No LE edema. No palpable cords in the calves bilaterally. I am unable to reproduce the patient's diffuse tenderness to palpation on the posterior aspect of the right lower extremity. There is no rash on the leg. The patient does have an occasional jerking and not right lower extremity without an obvious muscle spasm in the calf or identifiable single muscle body Neurologic:  A&Ox3.  Speech is clear.  Face and smile are symmetric.  EOMI.  Moves all extremities well. Table gait without ataxia. Skin:  Skin is warm, dry and intact. No rash noted. Psychiatric: Mood and affect are normal. Speech and behavior are normal.  Normal judgement.  ____________________________________________   LABS (all labs ordered  are listed, but only abnormal results are displayed)  Labs Reviewed  BASIC METABOLIC PANEL - Abnormal; Notable for the following:    Glucose, Bld 117 (*)    All other components within normal limits  MAGNESIUM  CK   ____________________________________________  EKG  Not indicated ____________________________________________  RADIOLOGY  US Venous Img Lower Unilateral Right  08/03/2015  CLINICAL DATA:  Acute onset of right lower extremity swelling and pain at the calf. Initial encounter. EXAM: RIGHT LOWER EXTREMITY VENOUS DOPPLER ULTRASOUND TECHNIQUE: Gray-scale sonography with graded compression, as well as color Doppler and duplex ultrasound were  performed to evaluate the lower extremity deep venous systems from the level of the common femoral vein and including the common femoral, femoral, profunda femoral, popliteal and calf veins including the posterior tibial, peroneal and gastrocnemius veins when visible. The superficial great saphenous vein was also interrogated. Spectral Doppler was utilized to evaluate flow at rest and with distal augmentation maneuvers in the common femoral, femoral and popliteal veins. COMPARISON:  None. FINDINGS: Contralateral Common Femoral Vein: Respiratory phasicity is normal and symmetric with the symptomatic side. No evidence of thrombus. Normal compressibility. Common Femoral Vein: No evidence of thrombus. Normal compressibility, respiratory phasicity and response to augmentation. Saphenofemoral Junction: No evidence of thrombus. Normal compressibility and flow on color Doppler imaging. Profunda Femoral Vein: No evidence of thrombus. Normal compressibility and flow on color Doppler imaging. Femoral Vein: No evidence of thrombus. Normal compressibility, respiratory phasicity and response to augmentation. Popliteal Vein: No evidence of thrombus. Normal compressibility, respiratory phasicity and response to augmentation. Calf Veins: No evidence of thrombus. Normal compressibility and flow on color Doppler imaging. Superficial Great Saphenous Vein: No evidence of thrombus. Normal compressibility and flow on color Doppler imaging. Venous Reflux:  None. Other Findings:  None. IMPRESSION: No evidence of deep venous thrombosis. Electronically Signed   By: Garald Balding M.D.   On: 08/03/2015 19:55    ____________________________________________   PROCEDURES  Procedure(s) performed: None  Critical Care performed: No ____________________________________________   INITIAL IMPRESSION / ASSESSMENT AND PLAN / ED COURSE  Pertinent labs & imaging results that were available during my care of the patient were reviewed by me  and considered in my medical decision making (see chart for details).  70 y.o. female presenting with intermittent jerking of the right lower extremity associated with diffuse leg pain. The patient is from triage which did does not show a dvt. i'll check the patient for abnormal electrolytes, although she is not on a diuretic. i will treat the patient with an antispasmodic and reevaluate her.  ----------------------------------------- 10:15 PM on 08/03/2015 -----------------------------------------  The patient's lab work is reassuring and her symptoms have significantly improved after Valium. I'll plan to discharge her home with close follow-up with her primary care physician. She understands return precautions as well as follow-up instructions. ____________________________________________  FINAL CLINICAL IMPRESSION(S) / ED DIAGNOSES  Final diagnoses:  Muscle spasm of both lower legs      NEW MEDICATIONS STARTED DURING THIS VISIT:  New Prescriptions   DIAZEPAM (VALIUM) 5 MG TABLET    Take 1 tablet (5 mg total) by mouth every 8 (eight) hours as needed for muscle spasms.     Eula Listen, MD 08/03/15 2216

## 2015-08-03 NOTE — Discharge Instructions (Signed)
Please drink plenty of fluid to stay well-hydrated. You may take Tylenol or Motrin for mild to moderate pain. Valium as for severe pain but can cause drowsiness so please make sure that you are careful to prevent falls. Do not drive within 8 hours of taking Valium.  Return to the emergency department if you develop severe pain, numbness tingling or weakness, inability to walk, or any other symptoms concerning to you.  Muscle Cramps and Spasms Muscle cramps and spasms occur when a muscle or muscles tighten and you have no control over this tightening (involuntary muscle contraction). They are a common problem and can develop in any muscle. The most common place is in the calf muscles of the leg. Both muscle cramps and muscle spasms are involuntary muscle contractions, but they also have differences:   Muscle cramps are sporadic and painful. They may last a few seconds to a quarter of an hour. Muscle cramps are often more forceful and last longer than muscle spasms.  Muscle spasms may or may not be painful. They may also last just a few seconds or much longer. CAUSES  It is uncommon for cramps or spasms to be due to a serious underlying problem. In many cases, the cause of cramps or spasms is unknown. Some common causes are:   Overexertion.   Overuse from repetitive motions (doing the same thing over and over).   Remaining in a certain position for a long period of time.   Improper preparation, form, or technique while performing a sport or activity.   Dehydration.   Injury.   Side effects of some medicines.   Abnormally low levels of the salts and ions in your blood (electrolytes), especially potassium and calcium. This could happen if you are taking water pills (diuretics) or you are pregnant.  Some underlying medical problems can make it more likely to develop cramps or spasms. These include, but are not limited to:   Diabetes.   Parkinson disease.   Hormone disorders,  such as thyroid problems.   Alcohol abuse.   Diseases specific to muscles, joints, and bones.   Blood vessel disease where not enough blood is getting to the muscles.  HOME CARE INSTRUCTIONS   Stay well hydrated. Drink enough water and fluids to keep your urine clear or pale yellow.  It may be helpful to massage, stretch, and relax the affected muscle.  For tight or tense muscles, use a warm towel, heating pad, or hot shower water directed to the affected area.  If you are sore or have pain after a cramp or spasm, applying ice to the affected area may relieve discomfort.  Put ice in a plastic bag.  Place a towel between your skin and the bag.  Leave the ice on for 15-20 minutes, 03-04 times a day.  Medicines used to treat a known cause of cramps or spasms may help reduce their frequency or severity. Only take over-the-counter or prescription medicines as directed by your caregiver. SEEK MEDICAL CARE IF:  Your cramps or spasms get more severe, more frequent, or do not improve over time.  MAKE SURE YOU:   Understand these instructions.  Will watch your condition.  Will get help right away if you are not doing well or get worse.   This information is not intended to replace advice given to you by your health care provider. Make sure you discuss any questions you have with your health care provider.   Document Released: 08/11/2001 Document Revised: 06/16/2012 Document  Reviewed: 02/06/2012 Elsevier Interactive Patient Education [employer].

## 2015-08-03 NOTE — ED Notes (Signed)
Pt c/o right calf pain for the last month or so, report that this morning both her right leg and foot started jerking and that has increased the pain. Right ankle and calf are swollen and red, warm to touch.

## 2015-08-05 ENCOUNTER — Encounter: Payer: Self-pay | Admitting: Anesthesiology

## 2015-08-05 ENCOUNTER — Ambulatory Visit: Payer: Medicare PPO | Attending: Anesthesiology | Admitting: Anesthesiology

## 2015-08-05 VITALS — BP 139/69 | HR 84 | Temp 99.0°F | Resp 16 | Ht 61.0 in | Wt 154.0 lb

## 2015-08-05 DIAGNOSIS — M545 Low back pain, unspecified: Secondary | ICD-10-CM

## 2015-08-05 DIAGNOSIS — G8929 Other chronic pain: Secondary | ICD-10-CM | POA: Diagnosis not present

## 2015-08-05 DIAGNOSIS — M5416 Radiculopathy, lumbar region: Secondary | ICD-10-CM

## 2015-08-05 DIAGNOSIS — M5116 Intervertebral disc disorders with radiculopathy, lumbar region: Secondary | ICD-10-CM | POA: Diagnosis not present

## 2015-08-05 DIAGNOSIS — M5137 Other intervertebral disc degeneration, lumbosacral region: Secondary | ICD-10-CM

## 2015-08-05 MED ORDER — HYDROCODONE-IBUPROFEN 7.5-200 MG PO TABS: 1.0000 | ORAL_TABLET | Freq: Two times a day (BID) | ORAL | Status: AC | PRN

## 2015-08-05 NOTE — Patient Instructions (Addendum)
You were given a prescription for Hydrocodone today. Epidural Steroid Injection Patient Information  Description: The epidural space surrounds the nerves as they exit the spinal cord.  In some patients, the nerves can be compressed and inflamed by a bulging disc or a tight spinal canal (spinal stenosis).  By injecting steroids into the epidural space, we can bring irritated nerves into direct contact with a potentially helpful medication.  These steroids act directly on the irritated nerves and can reduce swelling and inflammation which often leads to decreased pain.  Epidural steroids may be injected anywhere along the spine and from the neck to the low back depending upon the location of your pain.   After numbing the skin with local anesthetic (like Novocaine), a small needle is passed into the epidural space slowly.  You may experience a sensation of pressure while this is being done.  The entire block usually last less than 10 minutes.  Conditions which may be treated by epidural steroids:   Low back and leg pain  Neck and arm pain  Spinal stenosis  Post-laminectomy syndrome  Herpes zoster (shingles) pain  Pain from compression fractures  Preparation for the injection:  1. Do not eat any solid food or dairy products within 8 hours of your appointment.  2. You may drink clear liquids up to 3 hours before appointment.  Clear liquids include water, black coffee, juice or soda.  No milk or cream please. 3. You may take your regular medication, including pain medications, with a sip of water before your appointment  Diabetics should hold regular insulin (if taken separately) and take 1/2 normal NPH dos the morning of the procedure.  Carry some sugar containing items with you to your appointment. 4. A driver must accompany you and be prepared to drive you home after your procedure.  5. Bring all your current medications with your. 6. An IV may be inserted and sedation may be given at the  discretion of the physician.   7. A blood pressure cuff, EKG and other monitors will often be applied during the procedure.  Some patients may need to have extra oxygen administered for a short period. 8. You will be asked to provide medical information, including your allergies, prior to the procedure.  We must know immediately if you are taking blood thinners (like Coumadin/Warfarin)  Or if you are allergic to IV iodine contrast (dye). We must know if you could possible be pregnant.  Possible side-effects:  Bleeding from needle site  Infection (rare, may require surgery)  Nerve injury (rare)  Numbness & tingling (temporary)  Difficulty urinating (rare, temporary)  Spinal headache ( a headache worse with upright posture)  Light -headedness (temporary)  Pain at injection site (several days)  Decreased blood pressure (temporary)  Weakness in arm/leg (temporary)  Pressure sensation in back/neck (temporary)  Call if you experience:  Fever/chills associated with headache or increased back/neck pain.  Headache worsened by an upright position.  New onset weakness or numbness of an extremity below the injection site  Hives or difficulty breathing (go to the emergency room)  Inflammation or drainage at the infection site  Severe back/neck pain  Any new symptoms which are concerning to you  Please note:  Although the local anesthetic injected can often make your back or neck feel good for several hours after the injection, the pain will likely return.  It takes 3-7 days for steroids to work in the epidural space.  You may not notice any pain  relief for at least that one week.  If effective, we will often do a series of three injections spaced 3-6 weeks apart to maximally decrease your pain.  After the initial series, we generally will wait several months before considering a repeat injection of the same type.  If you have any questions, please call [phone] [facility]

## 2015-08-05 NOTE — Progress Notes (Signed)
Patient here for evaluation and medication refill. Safety precautions to be maintained throughout the outpatient stay will include: orient to surroundings, keep bed in low position, maintain call bell within reach at all times, provide assistance with transfer out of bed and ambulation.

## 2015-08-08 NOTE — Progress Notes (Signed)
   Subjective:    Patient ID: Jenna Gutierrez, female    DOB: [birth year], 70 y.o.   MRN: [id number]  HPI  This patient appears to be in no distress. She indicates that she cannot function without her Vicoprofen Her subjective pain intensity rating  Is 60% He was scheduled for a caudal epidural steroid injection today but she had difficulty getting a driver   Review of Systems  Constitutional: Negative.   HENT: Negative.   Eyes: Negative.   Respiratory: Negative.   Cardiovascular: Negative.   Gastrointestinal: Negative.   Endocrine: Negative.   Genitourinary: Negative.   Musculoskeletal: Positive for myalgias, back pain, arthralgias and gait problem. Negative for joint swelling, neck pain and neck stiffness.  Skin: Negative.   Allergic/Immunologic: Negative.   Neurological: Negative.   Hematological: Negative.   Psychiatric/Behavioral: Negative.        Objective:   Physical Exam  Cardiovascular:  Physical  examination is unremarkable Vital signs are stable Blood pressure is 139/69 mmHg Pulse is 84 bpm Equal and regular Temperature is 90.21F Respirations are 16 breaths per minute SPO2 was 98% There are no new neurological or musculo Skeletal  findings  Nursing note and vitals reviewed.         Assessment & Plan:    Assessment 1 chronic low back pain 2 lumbar degenerative disc disease 3 umbar radiculopathy   Plan of management 1 we'll plan a caudal epidural steroid  Injection at the next visit 2 we'll continue Vicoprofen 7.5/200mg  twice a day # 42 tablets 3 we'll follow up with her in 3 weeks   Established patient       Level III    Lance Bosch M.D.

## 2015-08-26 ENCOUNTER — Ambulatory Visit: Payer: Medicare PPO | Admitting: Anesthesiology

## 2015-09-09 ENCOUNTER — Encounter: Payer: Medicare PPO | Admitting: Anesthesiology

## 2015-11-18 ENCOUNTER — Encounter (INDEPENDENT_AMBULATORY_CARE_PROVIDER_SITE_OTHER): Payer: Self-pay

## 2015-12-16 ENCOUNTER — Ambulatory Visit (INDEPENDENT_AMBULATORY_CARE_PROVIDER_SITE_OTHER): Payer: Medicare PPO | Admitting: Vascular Surgery

## 2016-11-09 IMAGING — CR DG CHEST 2V
1 series · 2 of 2 positions shown · non-contrast
Comparison: Chest radiographs 10/19/2005.

CLINICAL DATA: 69-year-old female with shortness of breath for 1
week. Initial encounter.

EXAM:
CHEST  2 VIEW

[Series 1: dg chest 2 view · 0.14mm/px · 2 of 2 slices shown]
[im 1/2]
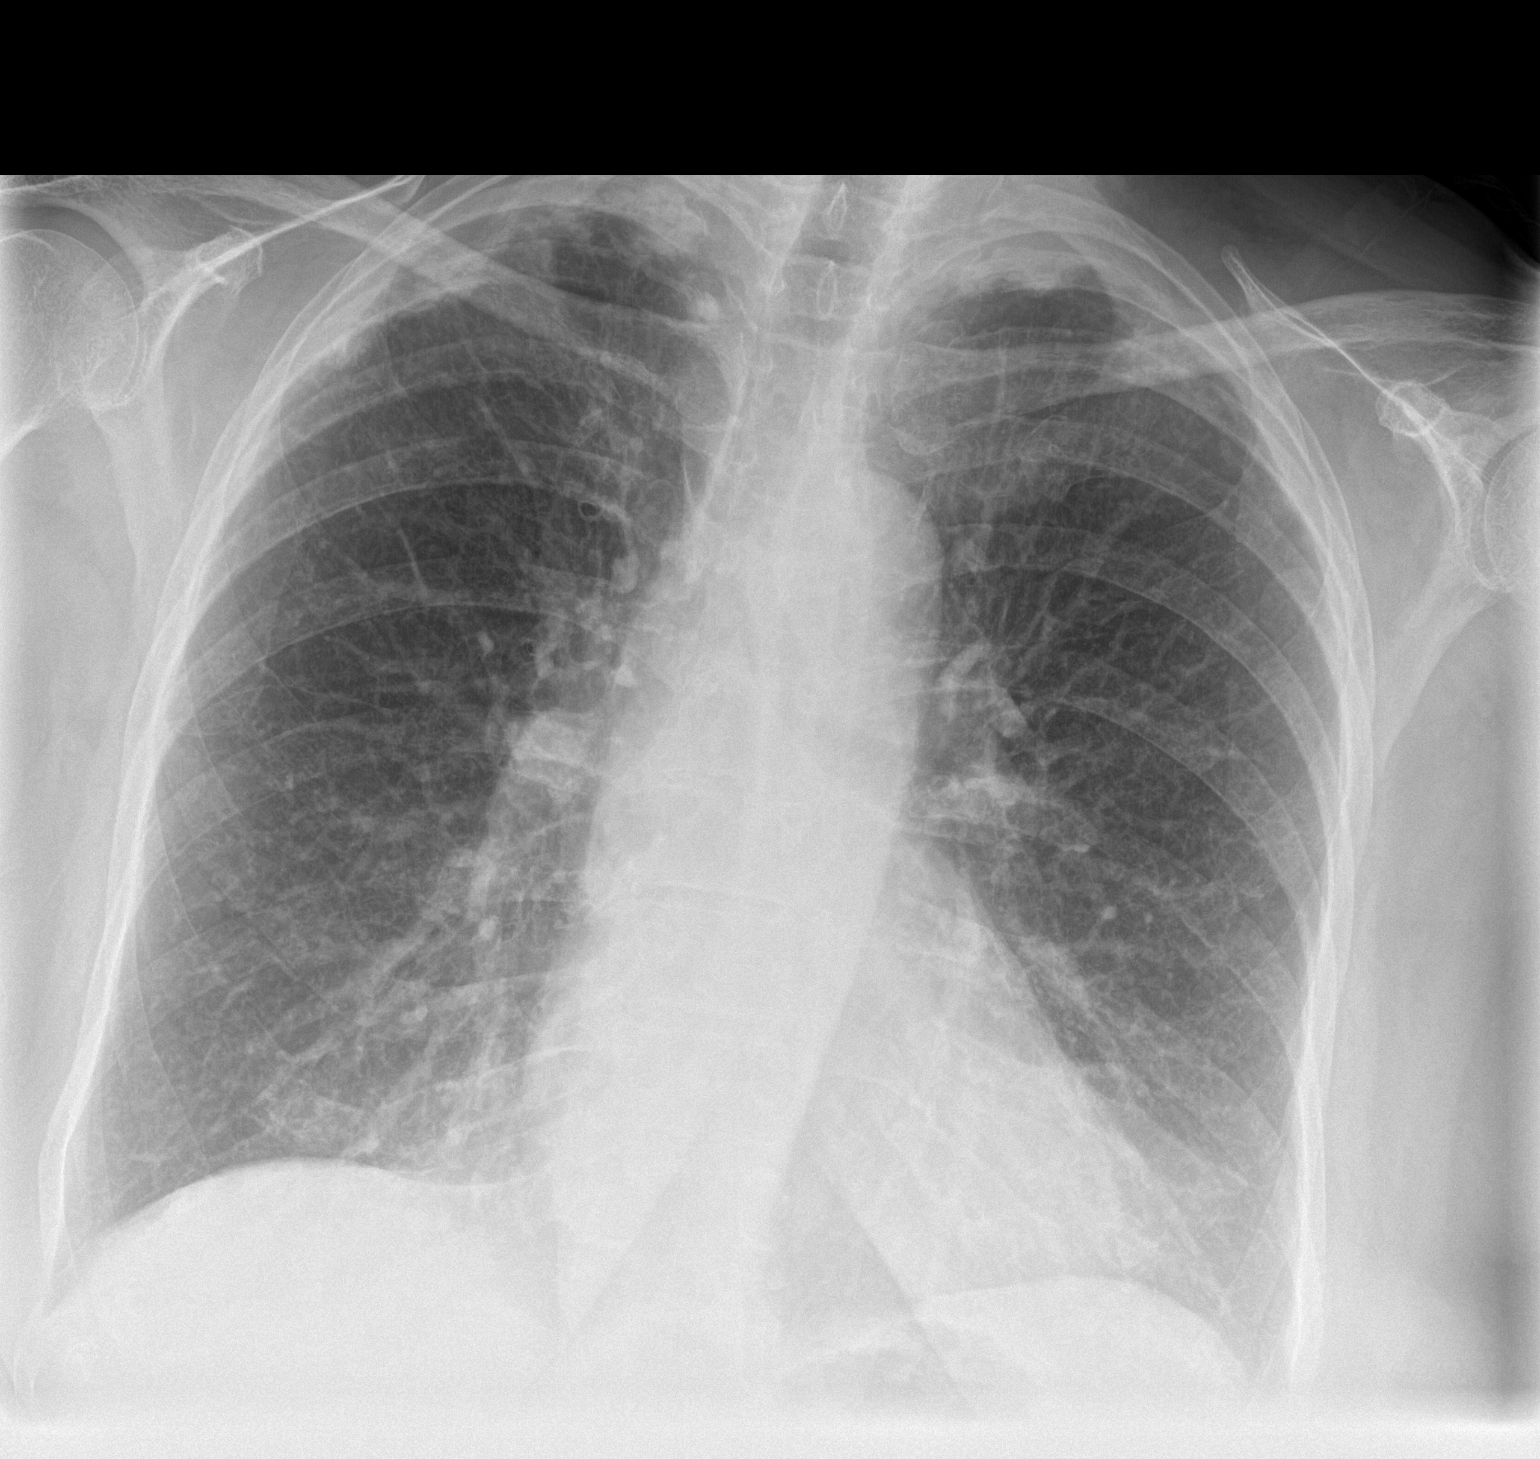
[im 2/2]
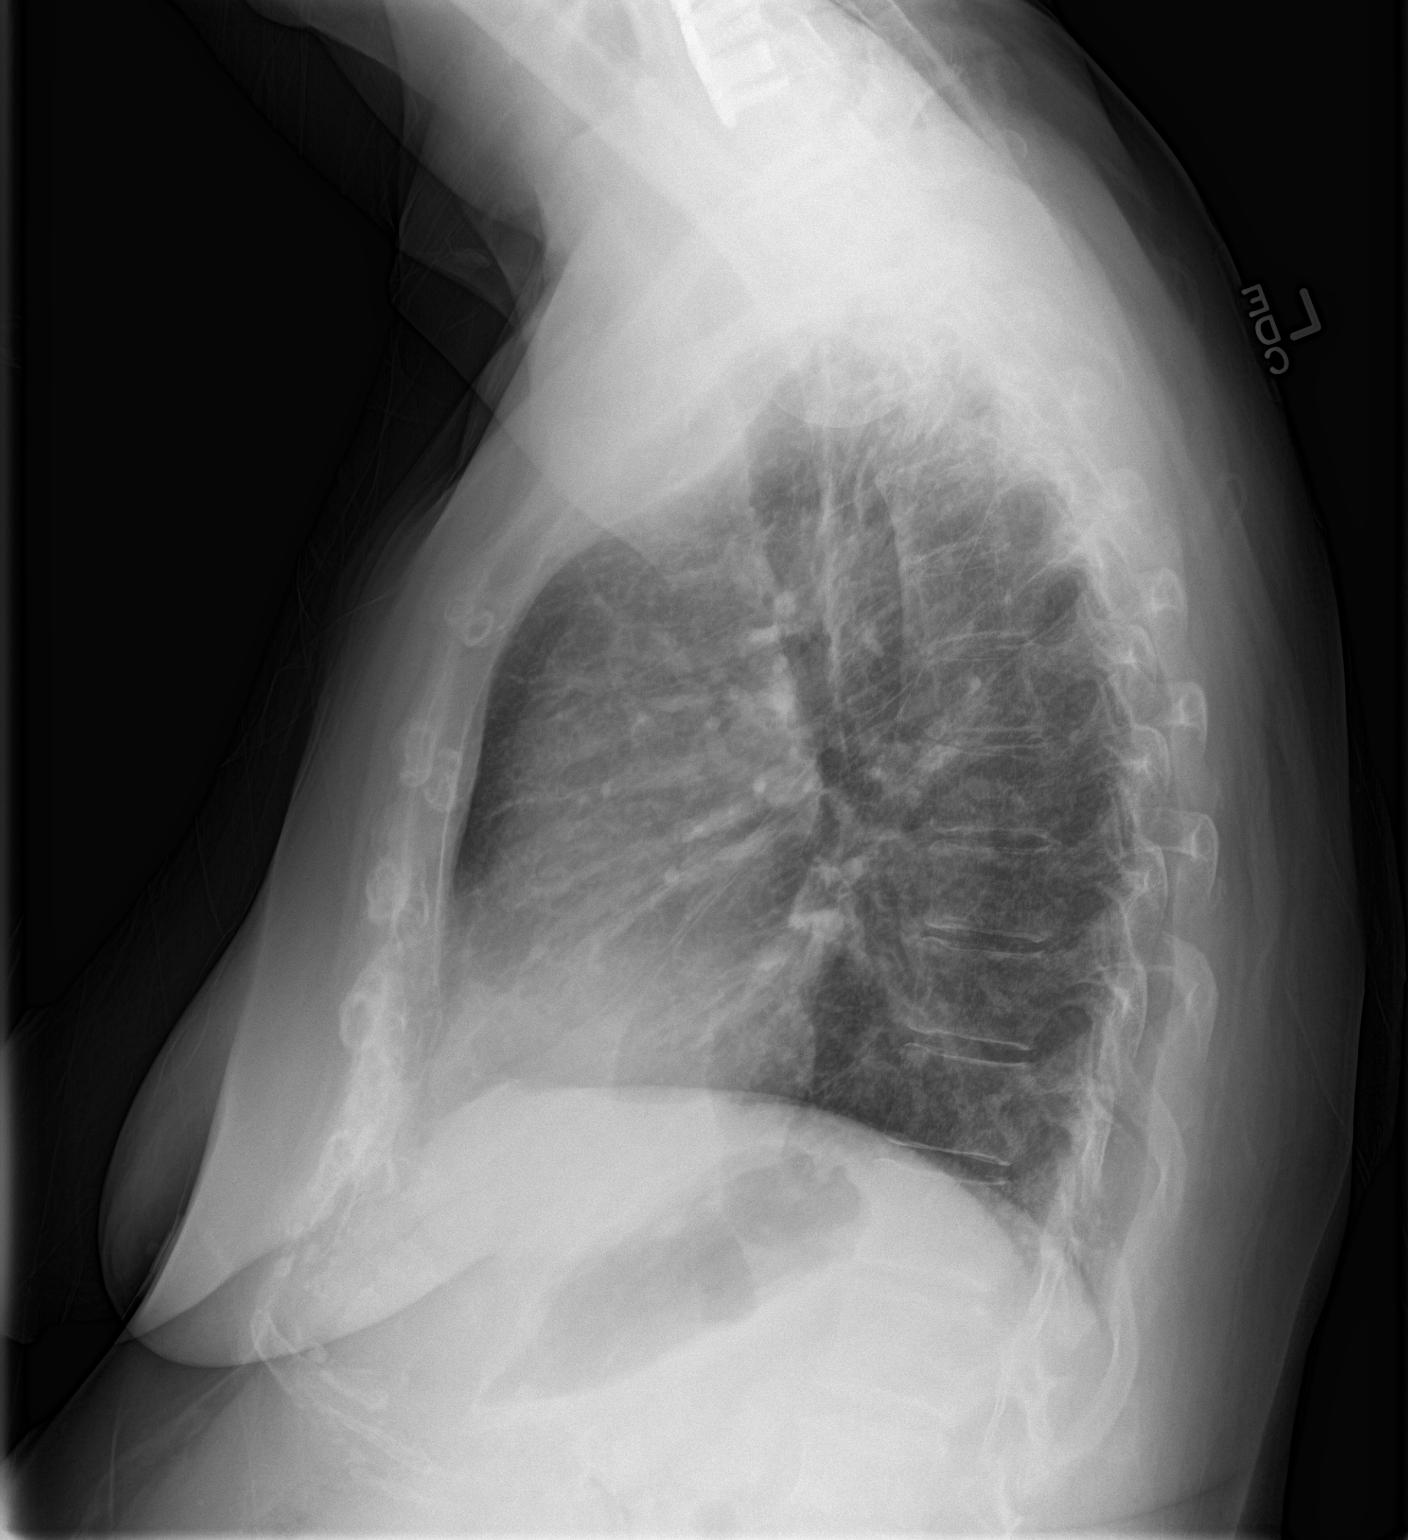

[2 of 2 positions shown; findings below may reference images not displayed]

FINDINGS: Mildly lower lung volumes. Stable cardiac size and mediastinal
contours. Chronic confluent pleural and parenchymal scarring in both
lung apices has not significantly changed since 1004. Chronic
increased interstitial markings in the lungs. No pneumothorax,
pulmonary edema, pleural effusion or acute pulmonary opacity. No
acute osseous abnormality identified. Partially visible lower
cervical ACDF.
IMPRESSION: No acute cardiopulmonary abnormality.

## 2017-07-05 ENCOUNTER — Encounter: Payer: Self-pay | Admitting: Gastroenterology

## 2020-05-12 ENCOUNTER — Ambulatory Visit: Payer: Medicare HMO

## 2020-05-12 DIAGNOSIS — Z7189 Other specified counseling: Secondary | ICD-10-CM | POA: Insufficient documentation

## 2020-05-12 DIAGNOSIS — G4733 Obstructive sleep apnea (adult) (pediatric): Secondary | ICD-10-CM | POA: Insufficient documentation

## 2020-05-12 DIAGNOSIS — Z9989 Dependence on other enabling machines and devices: Secondary | ICD-10-CM | POA: Insufficient documentation

## 2020-05-12 NOTE — Progress Notes (Unsigned)
[facility] [place], [place] [address]  Pulmonary Sleep Medicine   Office Visit Note  Patient Name: Jenna Gutierrez DOB: 24-Jul-1945 MRN [id number]    Chief Complaint: Obstructive Sleep Apnea visit  Brief History:  Jenna Gutierrez is seen today for follow up The patient has a 6 year history of sleep apnea. Patient is using PAP nightly.  The patient feels somewhat more rested after sleeping with PAP.  The patient reports benefiting from PAP use. Reported sleepiness is  improved and the Epworth Sleepiness Score is 6 out of 24. The patient does not take naps. The patient complains of the following: no complaints  The compliance download showsexcellent  compliance with an average use time of 7.5 hours. The AHI is 2.6 but variable  The patient reports occasional RLS symptoms but no difficulty inititiating sleep. She does not always feel rested in the morning.    ROS  General: (-) fever, (-) chills, (-) night sweat Nose and Sinuses: (-) nasal stuffiness or itchiness, (-) postnasal drip, (-) nosebleeds, (-) sinus trouble. Mouth and Throat: (-) sore throat, (-) hoarseness. Neck: (-) swollen glands, (-) enlarged thyroid, (-) neck pain. Respiratory: *** cough, *** shortness of breath, *** wheezing. Neurologic: *** numbness, *** tingling. Psychiatric: *** anxiety, *** depression   Current Medication: Outpatient Encounter Medications as of 05/12/2020  Medication Sig Note  . co-enzyme Q-10 30 MG capsule Take 30 mg by mouth 3 (three) times daily.   Marland Kitchen docusate sodium (COLACE) 100 MG capsule Take 100 mg by mouth 2 (two) times daily.   . magnesium oxide (MAG-OX) 400 MG tablet Take 400 mg by mouth daily.   . metFORMIN (GLUCOPHAGE) 500 MG tablet Take 1,000 mg by mouth 2 (two) times daily with a meal.   . albuterol (PROAIR HFA) 108 (90 BASE) MCG/ACT inhaler Inhale 2 puffs into the lungs every 6 (six) hours as needed.     Marland Kitchen albuterol (PROVENTIL HFA;VENTOLIN HFA) 108 (90 BASE) MCG/ACT  inhaler Inhale into the lungs every 6 (six) hours as needed for wheezing or shortness of breath. Reported on 03/09/2015   . albuterol (VENTOLIN HFA) 108 (90 Base) MCG/ACT inhaler Inhale into the lungs.   Marland Kitchen dexlansoprazole (KAPIDEX) 60 MG capsule Take 60 mg by mouth daily. 20 min before breakfast    . diclofenac (FLECTOR) 1.3 % PTCH Place 1 patch onto the skin daily after supper. (Patient not taking: Reported on 07/22/2015)   . diclofenac Sodium (VOLTAREN) 1 % GEL Apply topically.   . DULoxetine (CYMBALTA) 30 MG capsule Take 30 mg by mouth daily. Reported on 03/09/2015   . fluticasone (FLONASE) 50 MCG/ACT nasal spray Place 1 spray into the nose 2 (two) times daily. Reported on 03/09/2015   . fluticasone (FLONASE) 50 MCG/ACT nasal spray Place into both nostrils daily. Reported on 08/05/2015   . fluticasone (FLOVENT HFA) 220 MCG/ACT inhaler Inhale 2 puffs into the lungs 2 (two) times daily. Reported on 05/09/2015   . HYDROcodone-ibuprofen (VICOPROFEN) 7.5-200 MG tablet Take 1 tablet by mouth 2 (two) times daily after a meal.   . HYDROcodone-ibuprofen (VICOPROFEN) 7.5-200 MG tablet Take 1 tablet by mouth 2 (two) times daily between meals as needed for moderate pain.   Marland Kitchen levothyroxine (LEVO-T) 75 MCG tablet Take 75 mcg by mouth daily before breakfast. Reported on 07/22/2015   . levothyroxine (LEVOTHROID) 50 MCG tablet Take 50 mcg by mouth daily. Reported on 07/08/2015   . methylPREDNISolone (MEDROL DOSEPAK) 4 MG TBPK tablet Take as directed (Patient  not taking: Reported on 03/09/2015)   . modafinil (PROVIGIL) 200 MG tablet Take 200 mg by mouth daily. Reported on 08/05/2015   . NON FORMULARY Reported on 03/09/2015   . omeprazole (PRILOSEC) 40 MG capsule Take by mouth.   . [DISCONTINUED] acetaminophen-codeine (TYLENOL #3) 300-30 MG per tablet Take 1 tablet by mouth every 6 (six) hours as needed for moderate pain. (Patient not taking: Reported on 06/08/2015)   . [DISCONTINUED] amoxicillin-clavulanate (AUGMENTIN) 875-125 MG  tablet Take 1 tablet by mouth every 12 (twelve) hours. (Patient not taking: Reported on 04/12/2015)   . [DISCONTINUED] Armodafinil (NUVIGIL) 250 MG tablet Take 250 mg by mouth daily. Reported on 08/05/2015 08/05/2015: Patient reports she is not taking because insurance will not pay for it.   . [DISCONTINUED] cholecalciferol (VITAMIN D) 1000 UNITS tablet Take 1,000 Units by mouth daily. Reported on 03/09/2015   . [DISCONTINUED] dexlansoprazole (DEXILANT) 60 MG capsule Take 60 mg by mouth daily. Reported on 03/09/2015   . [DISCONTINUED] diazepam (VALIUM) 5 MG tablet Take 1 tablet (5 mg total) by mouth every 8 (eight) hours as needed for muscle spasms. (Patient not taking: Reported on 08/05/2015)   . [DISCONTINUED] lubiprostone (AMITIZA) 24 MCG capsule Take 24 mcg by mouth 2 (two) times daily with a meal.   . [DISCONTINUED] Sodium Oxybate (XYREM) 500 MG/ML SOLN Take by mouth as directed. Reported on 08/05/2015   . [DISCONTINUED] sucralfate (CARAFATE) 1 G tablet Take 1 g by mouth. Reported on 03/15/2015    Facility-Administered Encounter Medications as of 05/12/2020  Medication  . bupivacaine (PF) (MARCAINE) 0.25 % injection 20 mL  . fentaNYL (SUBLIMAZE) injection 50 mcg  . iohexol (OMNIPAQUE) 180 MG/ML injection 20 mL  . midazolam (VERSED) 5 MG/5ML injection 1 mg  . triamcinolone acetonide (KENALOG-40) injection 80 mg    Surgical History: Past Surgical History:  Procedure Laterality Date  . ABDOMINAL HERNIA REPAIR  2008   Dr. Rise Patience  . ABDOMINAL HYSTERECTOMY    . APPENDECTOMY  1973  . APPENDECTOMY    . back and neck  1994  . BACK SURGERY    . benign liver tumor removal    . BLADDER SURGERY     x4  . bladder tack  2001  . BRAIN SURGERY  2008  . bunion surgerty    . carpel tunnel  2001  . cataracts  2009  . CHOLECYSTECTOMY  1980  . CHOLECYSTECTOMY    . EYE SURGERY    . hysterectomy (other)  1976    Medical History: Past Medical History:  Diagnosis Date  . Anemia   . Arthritis   . Asthma    . Asthma   . 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 Bradycardia   . BRADYCARDIA 08/25/2008   Qualifier: Diagnosis of  By: Madilyn Fireman MD, Barnetta Chapel    . COPD (chronic obstructive pulmonary disease) ([place])   . Diverticulosis   . Emphysema of lung ([place])   . GERD (gastroesophageal reflux disease)   . Hypertension   . IBS (irritable bowel syndrome)   . MVP (mitral valve prolapse)   . Narcolepsy   . Unspecified hypothyroidism     Family History: Non contributory to the present illness  Social History: Social History   Socioeconomic History  . Marital status: Married    Spouse name: Not on file  . Number of children: Not on file  . Years of education: Not on file  . Highest education level: Not on file  Occupational History  .  Not on file  Tobacco Use  . Smoking status: Former Smoker    Quit date: 03/09/2007    Years since quitting: 13.1  . Smokeless tobacco: Never Used  Substance and Sexual Activity  . Alcohol use: No    Alcohol/week: 0.0 standard drinks  . Drug use: No  . Sexual activity: Not on file  Other Topics Concern  . Not on file  Social History Narrative   ** Merged History Encounter **       Does not exercise.    Social Determinants of Health   Financial Resource Strain: Not on file  Food Insecurity: Not on file  Transportation Needs: Not on file  Physical Activity: Not on file  Stress: Not on file  Social Connections: Not on file  Intimate Partner Violence: Not on file    Vital Signs: There were no vitals taken for this visit.  Examination: General Appearance: The patient is well-developed, well-nourished, and in no distress. Neck Circumference: 39 Skin: Gross inspection of skin unremarkable. Head: normocephalic, no gross deformities. Eyes: no gross deformities noted. ENT: ears appear grossly normal Neurologic: Alert and oriented. No involuntary movements.    EPWORTH SLEEPINESS SCALE:  Scale:  (0)= no chance of dozing; (1)= slight chance of dozing;  (2)= moderate chance of dozing; (3)= high chance of dozing  Chance  Situtation    Sitting and reading: 0    Watching TV: 1    Sitting Inactive in public: 1    As a passenger in car: 1      Lying down to rest: 1    Sitting and talking: 1    Sitting quielty after lunch: 1    In a car, stopped in traffic: 0   TOTAL SCORE:   6 out of 24    SLEEP STUDIES:  1. PSG 07/2014 AHI 43 S pO32min 64%   CPAP COMPLIANCE DATA:  Date Range: 05/02/19-04/30/20  Average Daily Use: 7.5 hours  Median Use: 7.7  Compliance for > 4 Hours: 98%  AHI: 2.6 respiratory events per hour  Days Used: 359/365  Mask Leak: 5.5  95th Percentile Pressure: 8         LABS: No results found for this or any previous visit (from the past 2160 hour(s)).  Radiology: US Venous Img Lower Unilateral Right  Result Date: 08/03/2015 CLINICAL DATA:  Acute onset of right lower extremity swelling and pain at the calf. Initial encounter. EXAM: RIGHT LOWER EXTREMITY VENOUS DOPPLER ULTRASOUND TECHNIQUE: Gray-scale sonography with graded compression, as well as color Doppler and duplex ultrasound were performed to evaluate the lower extremity deep venous systems from the level of the common femoral vein and including the common femoral, femoral, profunda femoral, popliteal and calf veins including the posterior tibial, peroneal and gastrocnemius veins when visible. The superficial great saphenous vein was also interrogated. Spectral Doppler was utilized to evaluate flow at rest and with distal augmentation maneuvers in the common femoral, femoral and popliteal veins. COMPARISON:  None. FINDINGS: Contralateral Common Femoral Vein: Respiratory phasicity is normal and symmetric with the symptomatic side. No evidence of thrombus. Normal compressibility. Common Femoral Vein: No evidence of thrombus. Normal compressibility, respiratory phasicity and response to augmentation. Saphenofemoral Junction: No evidence of thrombus.  Normal compressibility and flow on color Doppler imaging. Profunda Femoral Vein: No evidence of thrombus. Normal compressibility and flow on color Doppler imaging. Femoral Vein: No evidence of thrombus. Normal compressibility, respiratory phasicity and response to augmentation. Popliteal Vein: No evidence of thrombus. Normal compressibility,  respiratory phasicity and response to augmentation. Calf Veins: No evidence of thrombus. Normal compressibility and flow on color Doppler imaging. Superficial Great Saphenous Vein: No evidence of thrombus. Normal compressibility and flow on color Doppler imaging. Venous Reflux:  None. Other Findings:  None. IMPRESSION: No evidence of deep venous thrombosis. Electronically Signed   By: Garald Balding M.D.   On: 08/03/2015 19:55    No results found.  No results found.    Assessment and Plan: Patient Active Problem List   Diagnosis Date Noted  . Lumbar radiculopathy 04/12/2015  . UTI (lower urinary tract infection) 03/09/2015  . Acute cystitis without hematuria 03/09/2015  . Drug-seeking behavior 03/09/2015  . Vaginal atrophy 03/09/2015  . Back pain at L4-L5 level 01/25/2015  . DDD (degenerative disc disease), lumbosacral 01/25/2015  . Lumbosacral radiculopathy 01/25/2015  . EOSINOPHILIC ESOPHAGITIS 97/35/3299  . ANEMIA, IRON DEFICIENCY 11/22/2009  . OTHER DYSPHAGIA 11/22/2009  . HEPATOMEGALY 11/22/2009  . NONSPECIFIC ABN FINDING RAD & OTH EXAM GI TRACT 11/30/2008  . UNSPECIFIED HYPOTHYROIDISM 08/25/2008  . BRADYCARDIA 08/25/2008  . FATIGUE 08/25/2008  . ABDOMINAL PAIN 08/25/2008  . GERD 05/17/2008  . CONSTIPATION 05/17/2008  . EPIGASTRIC PAIN 05/17/2008      The patient does tolerate PAP and reports significant benefit from PAP use. The patient is caring for her machine as recommended and advised to increase the humidifier setting for runny nose. The patient was also counselled on the importance of weight loss in helping to control the sleep  apnea.  The compliance has been excellent and the apnea is controlled. She has occasional RLS and occasional non-refreshing sleep and a variable AHI. She should follow up with her PCP to have her iron and ferritin checked.  1. OSA- continue excellent compliance  General Counseling: I have discussed the findings of the evaluation and examination with Jenna Gutierrez.  I have also discussed any further diagnostic evaluation thatmay be needed or ordered today. Jenna Gutierrez verbalizes understanding of the findings of todays visit. We also reviewed her medications today and discussed drug interactions and side effects including but not limited excessive drowsiness and altered mental states. We also discussed that there is always a risk not just to her but also people around her. she has been encouraged to call the office with any questions or concerns that should arise related to todays visit.  No orders of the defined types were placed in this encounter.       I have personally obtained a history, examined the patient, evaluated laboratory and imaging results, formulated the assessment and plan and placed orders.   Jenna Gutierrez Saunders Glance, PhD, FAASM  Diplomate, American Board of Sleep Medicine    Allyne Gee, MD [facility] Diplomate ABMS Pulmonary and Critical Care Medicine Sleep medicine
